# Patient Record
Sex: Female | Born: 1955 | Race: White | Hispanic: No | Marital: Single | Smoking: Former smoker
Health system: Southern US, Community
[De-identification: ages and names within clinical notes are randomized; demographics above are authoritative.]

## PROBLEM LIST (undated history)

## (undated) DIAGNOSIS — C169 Malignant neoplasm of stomach, unspecified: Secondary | ICD-10-CM

## (undated) DIAGNOSIS — G43909 Migraine, unspecified, not intractable, without status migrainosus: Secondary | ICD-10-CM

## (undated) HISTORY — DX: Migraine, unspecified, not intractable, without status migrainosus: G43.909

## (undated) HISTORY — DX: Malignant neoplasm of stomach, unspecified: C16.9

## (undated) HISTORY — PX: TONSILLECTOMY: SUR1361

---

## 2015-01-03 ENCOUNTER — Encounter: Payer: Self-pay | Admitting: Gastroenterology

## 2015-01-15 ENCOUNTER — Ambulatory Visit (INDEPENDENT_AMBULATORY_CARE_PROVIDER_SITE_OTHER): Payer: Self-pay | Admitting: Gastroenterology

## 2015-01-15 ENCOUNTER — Encounter: Payer: Self-pay | Admitting: Gastroenterology

## 2015-01-15 VITALS — BP 106/69 | HR 68 | Temp 97.2°F | Ht 66.0 in | Wt 108.6 lb

## 2015-01-15 DIAGNOSIS — R1013 Epigastric pain: Secondary | ICD-10-CM | POA: Insufficient documentation

## 2015-01-15 DIAGNOSIS — R634 Abnormal weight loss: Secondary | ICD-10-CM | POA: Insufficient documentation

## 2015-01-15 DIAGNOSIS — R1903 Right lower quadrant abdominal swelling, mass and lump: Secondary | ICD-10-CM

## 2015-01-15 DIAGNOSIS — R112 Nausea with vomiting, unspecified: Secondary | ICD-10-CM

## 2015-01-15 MED ORDER — PROMETHAZINE HCL 12.5 MG PO TABS: 12.5000 mg | ORAL_TABLET | Freq: Four times a day (QID) | ORAL | Status: DC | PRN

## 2015-01-15 NOTE — Progress Notes (Signed)
Primary Care Physician:  [name], MD  Primary Gastroenterologist:  Barney Drain, MD   Chief Complaint  Patient presents with  . Weight Loss  . Abdominal Pain  . Emesis    HPI:  Heidi Cooper is a 59 y.o. female here for further evaluation of N/V, abdominal pain and 35 pound weight loss.   She readily admits to seen GI in [place] about 3 weeks ago but states that she was dissatisfied with her care and will not be going back.  Several month history of GI symptoms, worse in the last couple months. States that she has felt "awful". She brought in a detailed calendar of her symptoms. Symptoms started abruptly the beginning of May. She woke up in the middle the night with right flank pain, pain into her back. That particular pain subsided over the course of several days. She saw her PCP, Dr. Bobby Rumpf, who ordered an abdominal ultrasound which was unremarkable. She subsequently describes a HIDA scan stating it was normal as well although she was sick all weekend after this test. She's had several month history of fullness in the epigastrium. Wake up in the middle the night with acid reflux, with take Gaviscon with minimal results. She was started on Zantac which she took for one month with no relief. Ultimately on placed on pantoprazole, has been on it for 2 months and still with no improvement of her symptoms. She now has frequent vomiting. She wakes up vomiting, not just with meals. No hematemesis. She's been finding it very hard to eat. She's lost 35 pounds in 3-4 months. She weighed 143 pounds before her illness started. Bowels move whenever she is able to eat. Really no constipation. No melena or rectal bleeding. Epigastric pain feels like a "charley horse". Pain radiates up into the throat.  Rarely takes ibuprofen. Denies BC or Goody powders.  Per PCP notes, patient's fiance died after getting burned smoking while using oxygen. Died a couple months ago.  Tried Zofran for nausea but made  her feels worse.    Current Outpatient Prescriptions  Medication Sig Dispense Refill  . fluticasone (FLONASE) 50 MCG/ACT nasal spray Place into the nose.    . pantoprazole (PROTONIX) 40 MG tablet Take by mouth.    . ranitidine (ZANTAC) 300 MG tablet Take by mouth.    . topiramate (TOPAMAX) 25 MG tablet Take by mouth.     No current facility-administered medications for this visit.    Allergies as of 01/15/2015 - Review Complete 01/15/2015  Allergen Reaction Noted  . Sulfa antibiotics  01/15/2015  . Penicillins Rash 01/15/2015    Past Medical History  Diagnosis Date  . Migraine     Past Surgical History  Procedure Laterality Date  . Tonsillectomy      Family History  Problem Relation Age of Onset  . Heart disease Mother   . Heart disease Father   . Colon cancer Neg Hx     Social History   Social History  . Marital Status: Unknown    Spouse Name: N/A  . Number of Children: 0  . Years of Education: N/A   Occupational History  . [employer]     Social History Main Topics  . Smoking status: Former Smoker    Quit date: 11/27/2014  . Smokeless tobacco: Not on file  . Alcohol Use: No  . Drug Use: No  . Sexual Activity: Not on file   Other Topics Concern  . Not on file   Social  History Narrative  . No narrative on file      ROS:  General: Negative for fever, chills, fatigue, weakness. See hpi. Eyes: Negative for vision changes.  ENT: Negative for hoarseness, difficulty swallowing , nasal congestion. CV: Negative for chest pain, angina, palpitations, dyspnea on exertion, peripheral edema.  Respiratory: Negative for dyspnea at rest, dyspnea on exertion, cough, sputum, wheezing.  GI: See history of present illness. GU:  Negative for dysuria, hematuria, urinary incontinence, urinary frequency, nocturnal urination.  MS: Negative for joint pain, see hpi Derm: Negative for rash or itching.  Neuro: Negative for weakness, abnormal sensation, seizure, frequent  headaches, memory loss, confusion.  Psych: Negative for anxiety, depression, suicidal ideation, hallucinations.  Endo:see hpi Heme: Negative for bruising or bleeding. Allergy: Negative for rash or hives.    Physical Examination:  BP 106/69 mmHg  Pulse 68  Temp(Src) 97.2 F (36.2 C)  Ht 5\' 6"  (1.676 m)  Wt 108 lb 9.6 oz (49.261 kg)  BMI 17.54 kg/m2   General: Well-nourished, well-developed in no acute distress.  Head: Normocephalic, atraumatic.   Eyes: Conjunctiva pink, no icterus. Mouth: Oropharyngeal mucosa moist and pink , no lesions erythema or exudate. Neck: Supple without thyromegaly, masses, or lymphadenopathy.  Lungs: Clear to auscultation bilaterally.  Heart: Regular rate and rhythm, no murmurs rubs or gallops.  Abdomen: Bowel sounds are normal, palpable mass in RLQ, tendernontender, nondistended, no hepatosplenomegaly or masses, no abdominal bruits or    hernia , no rebound or guarding.   Rectal: not performed Extremities: No lower extremity edema. No clubbing or deformities.  Neuro: Alert and oriented x 4 , grossly normal neurologically.  Skin: Warm and dry, no rash or jaundice.   Psych: Alert and cooperative, normal mood and affect.    Imaging Studies: No results found.

## 2015-01-15 NOTE — Patient Instructions (Addendum)
1. Please have your labs and CT done 01/18/15 or after. I office will schedule your CT.  2. Phenergan sent to your pharmacy for vomiting. May make you drowsy.

## 2015-01-16 DIAGNOSIS — R1903 Right lower quadrant abdominal swelling, mass and lump: Secondary | ICD-10-CM | POA: Insufficient documentation

## 2015-01-16 NOTE — Assessment & Plan Note (Signed)
59 year old lady with several month history of nausea/vomiting, abdominal pain, 35 pound weight loss. She reports negative gallbladder workup. Her abdominal ultrasound was negative. She states HIDA scan was negative as well. Symptoms have been progressive to the point that she's been unable to eat. Denies lower GI symptoms. On exam today, she has a palpable mass in the right lower quadrant. Concerning for malignancy. Explained to the patient we need to evaluate this prior to consideration of endoscopic evaluation. She is in agreement to a CT scan of the abdomen and pelvis with contrast, insurance goes into effect on September 1 and she would like to wait till then to have it done. Labs to be obtained as well. Further recommendations to follow.

## 2015-01-16 NOTE — Progress Notes (Signed)
CC'ED TO PCP 

## 2015-01-21 ENCOUNTER — Emergency Department (HOSPITAL_COMMUNITY): Payer: PRIVATE HEALTH INSURANCE

## 2015-01-21 ENCOUNTER — Emergency Department (HOSPITAL_COMMUNITY)
Admission: EM | Admit: 2015-01-21 | Discharge: 2015-01-21 | Disposition: A | Discharge status: Home / Self Care | Payer: PRIVATE HEALTH INSURANCE | Attending: Emergency Medicine | Admitting: Emergency Medicine

## 2015-01-21 ENCOUNTER — Encounter (HOSPITAL_COMMUNITY): Payer: Self-pay | Admitting: Cardiology

## 2015-01-21 DIAGNOSIS — Z880 Allergy status to penicillin: Secondary | ICD-10-CM | POA: Diagnosis not present

## 2015-01-21 DIAGNOSIS — Z7952 Long term (current) use of systemic steroids: Secondary | ICD-10-CM | POA: Diagnosis not present

## 2015-01-21 DIAGNOSIS — Z87891 Personal history of nicotine dependence: Secondary | ICD-10-CM | POA: Insufficient documentation

## 2015-01-21 DIAGNOSIS — K3184 Gastroparesis: Secondary | ICD-10-CM | POA: Diagnosis not present

## 2015-01-21 DIAGNOSIS — R1011 Right upper quadrant pain: Secondary | ICD-10-CM | POA: Diagnosis present

## 2015-01-21 DIAGNOSIS — G43909 Migraine, unspecified, not intractable, without status migrainosus: Secondary | ICD-10-CM | POA: Insufficient documentation

## 2015-01-21 DIAGNOSIS — R1084 Generalized abdominal pain: Secondary | ICD-10-CM

## 2015-01-21 DIAGNOSIS — R112 Nausea with vomiting, unspecified: Secondary | ICD-10-CM

## 2015-01-21 LAB — URINALYSIS, ROUTINE W REFLEX MICROSCOPIC
Bilirubin Urine: NEGATIVE
Glucose, UA: NEGATIVE mg/dL
LEUKOCYTES UA: NEGATIVE
NITRITE: NEGATIVE
PH: 5.5 (ref 5.0–8.0)
Protein, ur: NEGATIVE mg/dL
SPECIFIC GRAVITY, URINE: 1.01 (ref 1.005–1.030)
Urobilinogen, UA: 0.2 mg/dL (ref 0.0–1.0)

## 2015-01-21 LAB — CBC WITH DIFFERENTIAL/PLATELET
BASOS ABS: 0 10*3/uL (ref 0.0–0.1)
BASOS PCT: 1 % (ref 0–1)
Eosinophils Absolute: 0.1 10*3/uL (ref 0.0–0.7)
Eosinophils Relative: 1 % (ref 0–5)
HEMATOCRIT: 38.3 % (ref 36.0–46.0)
Hemoglobin: 13.3 g/dL (ref 12.0–15.0)
Lymphocytes Relative: 27 % (ref 12–46)
Lymphs Abs: 1.6 10*3/uL (ref 0.7–4.0)
MCH: 33 pg (ref 26.0–34.0)
MCHC: 34.7 g/dL (ref 30.0–36.0)
MCV: 95 fL (ref 78.0–100.0)
MONO ABS: 0.6 10*3/uL (ref 0.1–1.0)
Monocytes Relative: 9 % (ref 3–12)
NEUTROS ABS: 3.7 10*3/uL (ref 1.7–7.7)
Neutrophils Relative %: 62 % (ref 43–77)
PLATELETS: 208 10*3/uL (ref 150–400)
RBC: 4.03 MIL/uL (ref 3.87–5.11)
RDW: 13.7 % (ref 11.5–15.5)
WBC: 6 10*3/uL (ref 4.0–10.5)

## 2015-01-21 LAB — COMPREHENSIVE METABOLIC PANEL
ALBUMIN: 4.1 g/dL (ref 3.5–5.0)
ALT: 19 U/L (ref 14–54)
AST: 20 U/L (ref 15–41)
Alkaline Phosphatase: 40 U/L (ref 38–126)
Anion gap: 7 (ref 5–15)
BILIRUBIN TOTAL: 0.5 mg/dL (ref 0.3–1.2)
BUN: 18 mg/dL (ref 6–20)
CHLORIDE: 107 mmol/L (ref 101–111)
CO2: 26 mmol/L (ref 22–32)
Calcium: 9.4 mg/dL (ref 8.9–10.3)
Creatinine, Ser: 0.97 mg/dL (ref 0.44–1.00)
GFR calc Af Amer: >60 mL/min (ref >60)
GFR calc non Af Amer: >60 mL/min (ref >60)
GLUCOSE: 110 mg/dL — AB (ref 65–99)
POTASSIUM: 3.3 mmol/L — AB (ref 3.5–5.1)
Sodium: 140 mmol/L (ref 135–145)
Total Protein: 6.4 g/dL — ABNORMAL LOW (ref 6.5–8.1)

## 2015-01-21 LAB — LIPASE, BLOOD: Lipase: 31 U/L (ref 22–51)

## 2015-01-21 LAB — URINE MICROSCOPIC-ADD ON

## 2015-01-21 MED ORDER — IOHEXOL 300 MG/ML  SOLN
100.0000 mL | Freq: Once | INTRAMUSCULAR | Status: AC | PRN
  Administered 2015-01-21: 100 mL via INTRAVENOUS

## 2015-01-21 MED ORDER — ONDANSETRON 4 MG PO TBDP: 4.0000 mg | ORAL_TABLET | Freq: Three times a day (TID) | ORAL | Status: DC | PRN

## 2015-01-21 MED ORDER — METOCLOPRAMIDE HCL 10 MG PO TABS: 10.0000 mg | ORAL_TABLET | Freq: Three times a day (TID) | ORAL | Status: DC | PRN

## 2015-01-21 MED ORDER — SODIUM CHLORIDE 0.9 % IV BOLUS (SEPSIS)
1000.0000 mL | Freq: Once | INTRAVENOUS | Status: AC
  Administered 2015-01-21: 1000 mL via INTRAVENOUS

## 2015-01-21 MED ORDER — ONDANSETRON HCL 4 MG/2ML IJ SOLN
4.0000 mg | Freq: Once | INTRAMUSCULAR | Status: AC
  Administered 2015-01-21: 4 mg via INTRAVENOUS
  Filled 2015-01-21: qty 2

## 2015-01-21 MED ORDER — ONDANSETRON HCL 4 MG/2ML IJ SOLN: 4.0000 mg | Freq: Once | INTRAMUSCULAR | Status: DC

## 2015-01-21 MED ORDER — IOHEXOL 300 MG/ML  SOLN
50.0000 mL | Freq: Once | INTRAMUSCULAR | Status: AC | PRN
  Administered 2015-01-21: 50 mL via ORAL

## 2015-01-21 MED ORDER — ONDANSETRON HCL 4 MG/2ML IJ SOLN
INTRAMUSCULAR | Status: AC
  Filled 2015-01-21: qty 2

## 2015-01-21 NOTE — ED Provider Notes (Signed)
CSN: [id number]     Arrival date & time 01/21/15  1421 History   First MD Initiated Contact with Patient 01/21/15 1437     No chief complaint on file.    (Consider location/radiation/quality/duration/timing/severity/associated sxs/prior Treatment) HPI  The patient is a 59 year old female, she has no past abdominal surgical history, she has a history of approximately 8 months of a progressive gastrointestinal illness which started with pain in the right upper quadrant radiating to the right back, this has been progressive causing more nausea and vomiting reporting that she is unable to hold down any food or fluids. She reports that this is not an immediate vomit however between 2 and 24 hours after eating she will bring up the food or fluids. She feels dehydrated, she has a feeling of pain that goes from her epigastrium up into her chest and her neck, she endorses approximately 35 pounds of weight loss over the last several months, has seen the gastroenterologist was performed ultrasounds, HIDA scan and has recommended CT scan to rule out malignancy. The patient has had increased vomiting today thus reporting to the hospital for further evaluation. She denies fevers chills or diarrhea, states that her stools are small, no blood in the stool    Past Medical History  Diagnosis Date  . Migraine    Past Surgical History  Procedure Laterality Date  . Tonsillectomy     Family History  Problem Relation Age of Onset  . Heart disease Mother   . Heart disease Father   . Colon cancer Neg Hx    Social History  Substance Use Topics  . Smoking status: Former Smoker    Quit date: 11/27/2014  . Smokeless tobacco: None  . Alcohol Use: No   OB History    No data available     Review of Systems  All other systems reviewed and are negative.     Allergies  Other; Sulfa antibiotics; and Penicillins  Home Medications   Prior to Admission medications   Medication Sig Start Date End Date  Taking? Authorizing Provider  pantoprazole (PROTONIX) 40 MG tablet Take 40 mg by mouth at bedtime.  12/04/14  Yes Historical Provider, MD  ranitidine (ZANTAC) 300 MG tablet Take 300 mg by mouth 2 (two) times daily.  11/06/14  Yes Historical Provider, MD  topiramate (TOPAMAX) 25 MG tablet Take 25 mg by mouth at bedtime.  01/09/12  Yes Historical Provider, MD  fluticasone (FLONASE) 50 MCG/ACT nasal spray Place 2 sprays into the nose daily as needed for allergies.     Historical Provider, MD  metoCLOPramide (REGLAN) 10 MG tablet Take 1 tablet (10 mg total) by mouth 3 (three) times daily as needed for nausea (headache / nausea). 01/21/15   Noemi Chapel, MD  ondansetron (ZOFRAN ODT) 4 MG disintegrating tablet Take 1 tablet (4 mg total) by mouth every 8 (eight) hours as needed for nausea. 01/21/15   Noemi Chapel, MD  promethazine (PHENERGAN) 12.5 MG tablet Take 1 tablet (12.5 mg total) by mouth every 6 (six) hours as needed for nausea or vomiting. 01/15/15   Mahala Menghini, PA-C   BP 112/66 mmHg  Pulse 69  Temp(Src) 98.1 F (36.7 C) (Oral)  Resp 16  Ht 5\' 6"  (1.676 m)  Wt 108 lb (48.988 kg)  BMI 17.44 kg/m2  SpO2 100% Physical Exam  Constitutional: She appears well-developed and well-nourished. No distress.  HENT:  Head: Normocephalic and atraumatic.  Mouth/Throat: Oropharynx is clear and moist. No oropharyngeal exudate.  Eyes: Conjunctivae and EOM are normal. Pupils are equal, round, and reactive to light. Right eye exhibits no discharge. Left eye exhibits no discharge. No scleral icterus.  Neck: Normal range of motion. Neck supple. No JVD present. No thyromegaly present.  Cardiovascular: Normal rate, regular rhythm, normal heart sounds and intact distal pulses.  Exam reveals no gallop and no friction rub.   No murmur heard. Pulmonary/Chest: Effort normal and breath sounds normal. No respiratory distress. She has no wheezes. She has no rales.  Abdominal: Soft. Bowel sounds are normal. She exhibits no  distension and no mass. There is tenderness ( Mild tenderness in the epigastrium, no guarding, no masses, no masses palpated in the lower abdomen, very soft).  Musculoskeletal: Normal range of motion. She exhibits no edema or tenderness.  Lymphadenopathy:    She has no cervical adenopathy.  Neurological: She is alert. Coordination normal.  Skin: Skin is warm and dry. No rash noted. No erythema.  Psychiatric: She has a normal mood and affect. Her behavior is normal.  Nursing note and vitals reviewed.   ED Course  Procedures (including critical care time) Labs Review Labs Reviewed  COMPREHENSIVE METABOLIC PANEL - Abnormal; Notable for the following:    Potassium 3.3 (*)    Glucose, Bld 110 (*)    Total Protein 6.4 (*)    All other components within normal limits  URINALYSIS, ROUTINE W REFLEX MICROSCOPIC (NOT AT [facility]) - Abnormal; Notable for the following:    Hgb urine dipstick SMALL (*)    Ketones, ur TRACE (*)    All other components within normal limits  URINE MICROSCOPIC-ADD ON - Abnormal; Notable for the following:    Casts GRANULAR CAST (*)    All other components within normal limits  CBC WITH DIFFERENTIAL/PLATELET  LIPASE, BLOOD    Imaging Review Ct Abdomen Pelvis W Contrast  01/21/2015   CLINICAL DATA:  59 year old female with abdominal pain and vomiting for several months, worst for 1 week. Initial encounter.  EXAM: CT ABDOMEN AND PELVIS WITH CONTRAST  TECHNIQUE: Multidetector CT imaging of the abdomen and pelvis was performed using the standard protocol following bolus administration of intravenous contrast.  CONTRAST:  34mL OMNIPAQUE IOHEXOL 300 MG/ML SOLN, 176mL OMNIPAQUE IOHEXOL 300 MG/ML SOLN  COMPARISON:  None.  FINDINGS: Negative lung bases.  Mild elevation of the left hemidiaphragm.  No acute osseous abnormality identified.  No pelvic free fluid. Diminutive bladder. Calcified uterine fibroid measuring about 3 cm diameter. Diminutive adnexa.  Redundant sigmoid colon with  diverticulosis. Negative rectum. Negative left colon. Redundant transverse colon extends down into the pelvis but is otherwise negative. Mildly redundant hepatic flexure. Negative right colon. Appendix appears diminutive or absent. Negative terminal ileum. No dilated small bowel.  Massively distended and ptotic appearing stomach (coronal image 33). Retained food debris mixed with oral contrast in the stomach. Contrast has reached the distal small bowel but not yet the ileocecal valve. The duodenum is within normal limits. No abdominal free air or free fluid.  Liver, gallbladder, spleen, pancreas, and adrenal glands are within normal limits. The portal venous system is patent. Major arterial structures in the abdomen and pelvis are patent. Minimal calcified aortic atherosclerosis. No lymphadenopathy.  IMPRESSION: 1. Gastroptosis with massive enlargement of the stomach, but no acute bowel obstruction. Query severe gastroparesis. See coronal image 33. 2. No other acute or inflammatory process identified in the abdomen or pelvis. 3. Diverticulosis of the sigmoid colon. Redundant sigmoid and transverse colon. Fibroid uterus.   Electronically Signed  By: Genevie Ann M.D.   On: 01/21/2015 18:24   I have personally reviewed and evaluated these images and lab results as part of my medical decision-making.   MDM   Final diagnoses:  Generalized abdominal pain  Non-intractable vomiting with nausea, vomiting of unspecified type  Gastroparesis    Labs and CT scan ordered, pt at increased risk for CA given weight loss and persistent vomiting.  CT results shared with pt - stable VS and labs,  UA unremarkable - given 2L of IVF, stable for d/c.  Pt in agreement - given copy of her labs.  I have personally viewed and interpreted the imaging and agree with radiologist interpretation.  Meds given in ED:  Medications  sodium chloride 0.9 % bolus 1,000 mL (0 mLs Intravenous Stopped 01/21/15 1754)  ondansetron  (ZOFRAN) injection 4 mg (4 mg Intravenous Given 01/21/15 1521)  iohexol (OMNIPAQUE) 300 MG/ML solution 50 mL (50 mLs Oral Contrast Given 01/21/15 1733)  iohexol (OMNIPAQUE) 300 MG/ML solution 100 mL (100 mLs Intravenous Contrast Given 01/21/15 1734)  sodium chloride 0.9 % bolus 1,000 mL (1,000 mLs Intravenous New Bag/Given 01/21/15 1754)    New Prescriptions   METOCLOPRAMIDE (REGLAN) 10 MG TABLET    Take 1 tablet (10 mg total) by mouth 3 (three) times daily as needed for nausea (headache / nausea).   ONDANSETRON (ZOFRAN ODT) 4 MG DISINTEGRATING TABLET    Take 1 tablet (4 mg total) by mouth every 8 (eight) hours as needed for nausea.       Noemi Chapel, MD 01/21/15 [id number]

## 2015-01-21 NOTE — Discharge Instructions (Signed)

## 2015-01-21 NOTE — ED Notes (Signed)
Abdominal pain and vomiting for a week.  Seen PCP last Monday. Is to have an OP CT  of the abdomen.

## 2015-01-23 ENCOUNTER — Ambulatory Visit (INDEPENDENT_AMBULATORY_CARE_PROVIDER_SITE_OTHER): Payer: PRIVATE HEALTH INSURANCE | Admitting: Nurse Practitioner

## 2015-01-23 ENCOUNTER — Other Ambulatory Visit: Payer: Self-pay

## 2015-01-23 ENCOUNTER — Encounter: Payer: Self-pay | Admitting: Nurse Practitioner

## 2015-01-23 DIAGNOSIS — R112 Nausea with vomiting, unspecified: Secondary | ICD-10-CM | POA: Diagnosis not present

## 2015-01-23 DIAGNOSIS — R1013 Epigastric pain: Secondary | ICD-10-CM

## 2015-01-23 DIAGNOSIS — Z1211 Encounter for screening for malignant neoplasm of colon: Secondary | ICD-10-CM

## 2015-01-23 DIAGNOSIS — R634 Abnormal weight loss: Secondary | ICD-10-CM | POA: Diagnosis not present

## 2015-01-23 MED ORDER — PEG 3350-KCL-NA BICARB-NACL 420 G PO SOLR: 4000.0000 mL | Freq: Once | ORAL | Status: DC

## 2015-01-23 MED ORDER — ONDANSETRON 4 MG PO TBDP: 4.0000 mg | ORAL_TABLET | Freq: Three times a day (TID) | ORAL | Status: DC | PRN

## 2015-01-23 NOTE — Progress Notes (Signed)
Primary Care Physician:  [name], MD Primary Gastroenterologist:  Dr. Oneida Alar  Chief Complaint  Patient presents with  . Abdominal Pain    HPI:   59 year old female referred by emergency room. ER notes reviewed. Corneal their notes patient presented with a months of progressive GI problems scan is right upper quadrant pain radiating to the back which has caused more nausea and vomiting. She had difficulty keeping fluids down. Has epigastric pain which radiates up into her neck and chest. Last seen in our office 01/15/2015. His had a several month history of nausea vomiting, abdominal pain, 35 pound weight loss. Negative gallbladder workup. Patient does have a palpable mass in the right lower quadrant concerning for malignancy. Agree to CT of the abdomen and pelvis with contrast however needed to wait until September 1 due to insurance going into a fact that time. CT performed on 09/06/2014 which found gastric ptosis with massive enlargement of the stomach but no bowel obstruction, query severe gastroparesis. No other acute or inflammatory processes identified in the abdomen or pelvis, diverticulosis of the colon and redundant sigmoid and transverse colon. The contrast, however, did not get as far as the ileocecal valve. Fibroid uterus. She is discharged in the emergency room on Reglan and Zofran ODT.  Today she states she was not able to get the Reglan filled yet, but did get Zofran filled and has helped her nausea pretty well. Still has abdominal pain. She has not vomited today, last episode was yesterday at work. Denies fever, chills, hematochezia, and melena. Has never had a colonoscopy or EGD before. Denies chest pain, dyspnea, dizziness, lightheadedness, syncope, near syncope. Denies any other upper or lower GI symptoms.   Past Medical History  Diagnosis Date  . Migraine     Past Surgical History  Procedure Laterality Date  . Tonsillectomy      Current Outpatient  Prescriptions  Medication Sig Dispense Refill  . fluticasone (FLONASE) 50 MCG/ACT nasal spray Place 2 sprays into the nose daily as needed for allergies.     Marland Kitchen metoCLOPramide (REGLAN) 10 MG tablet Take 1 tablet (10 mg total) by mouth 3 (three) times daily as needed for nausea (headache / nausea). 20 tablet 0  . ondansetron (ZOFRAN ODT) 4 MG disintegrating tablet Take 1 tablet (4 mg total) by mouth every 8 (eight) hours as needed for nausea. 10 tablet 0  . pantoprazole (PROTONIX) 40 MG tablet Take 40 mg by mouth at bedtime.     . promethazine (PHENERGAN) 12.5 MG tablet Take 1 tablet (12.5 mg total) by mouth every 6 (six) hours as needed for nausea or vomiting. 20 tablet 0  . ranitidine (ZANTAC) 300 MG tablet Take 300 mg by mouth 2 (two) times daily.     Marland Kitchen topiramate (TOPAMAX) 25 MG tablet Take 25 mg by mouth at bedtime.      No current facility-administered medications for this visit.    Allergies as of 01/23/2015 - Review Complete 01/23/2015  Allergen Reaction Noted  . Other Itching 01/21/2015  . Sulfa antibiotics Other (See Comments) 01/15/2015  . Penicillins Rash 01/15/2015    Family History  Problem Relation Age of Onset  . Heart disease Mother   . Heart disease Father   . Colon cancer Neg Hx     Social History   Social History  . Marital Status: Unknown    Spouse Name: N/A  . Number of Children: 0  . Years of Education: N/A   Occupational History  .  [employer]     Social History Main Topics  . Smoking status: Former Smoker    Quit date: 11/27/2014  . Smokeless tobacco: Not on file  . Alcohol Use: No  . Drug Use: No  . Sexual Activity: Not on file   Other Topics Concern  . Not on file   Social History Narrative    Review of Systems: 10-point ROS negative except as per HPI.   Physical Exam: BP 119/72 mmHg  Pulse 67  Temp(Src) 97.4 F (36.3 C) (Oral)  Ht 5\' 6"  (1.676 m)  Wt 111 lb (50.349 kg)  BMI 17.92 kg/m2 General:   Alert and oriented. Pleasant and  cooperative. Well-nourished and well-developed.  Head:  Normocephalic and atraumatic. Eyes:  Without icterus, sclera clear and conjunctiva pink.  Ears:  Normal auditory acuity. Cardiovascular:  S1, S2 present without murmurs appreciated. Normal pulses noted. Extremities without clubbing or edema. Respiratory:  Clear to auscultation bilaterally. No wheezes, rales, or rhonchi. No distress.  Gastrointestinal:  +BS, rounded but soft, and non-distended. Mild epigastric TTP. No HSM noted. No guarding or rebound. No masses appreciated.  Rectal:  Deferred  Skin:  Intact without significant lesions or rashes. Neurologic:  Alert and oriented x4;  grossly normal neurologically. Psych:  Alert and cooperative. Normal mood and affect. Heme/Lymph/Immune: No excessive bruising noted.    01/23/2015 10:14 AM

## 2015-01-23 NOTE — Patient Instructions (Signed)
1. We will schedule your procedure for you. 2. Keep taking your medications. I have sent in a refill of your Zofran. 3. Follow-up in 6 weeks

## 2015-01-25 ENCOUNTER — Other Ambulatory Visit: Payer: Self-pay

## 2015-01-25 NOTE — Assessment & Plan Note (Signed)
Patient with concerning weight loss of 35 pounds. She is due for colonoscopy as she has never had one. This point we'll plan for colonoscopy and endoscopy as noted above for cancer screening as well as further evaluation of her upper GI symptoms.

## 2015-01-25 NOTE — Progress Notes (Signed)
CC'ED TO PCP 

## 2015-01-25 NOTE — Assessment & Plan Note (Signed)
Recent nausea and vomiting better controlled on Zofran. She was given Reglan by the emergency room however she has not had it filled yet. Her nausea and vomiting as well as her distended abdomen on CT could be due to severe gastroparesis however outlet obstruction is also possible. At this point we'll refer her for an EGD to further evaluate as well as screening colonoscopy because she is due.  Proceed with EGD and TCS with 25 mg Phenergan with Dr. Oneida Alar in near future: the risks, benefits, and alternatives have been discussed with the patient in detail. The patient states understanding and desires to proceed.  Patient is not on any chronic pain medicines or anxiolytics. She is not on any antidepressants or anticoagulants. She does take Phenergan for nausea. We will provide for Phenergan prior to procedure for nausea control.

## 2015-01-25 NOTE — Assessment & Plan Note (Signed)
Patient with epigastric pain likely due to distended stomach. CT abdomen and pelvis showed enlarged stomach without small bowel obstruction. We will work her up for colonoscopy and endoscopy as noted above. Return for follow-up in 6 weeks and consider gastric emptying study if no abnormalities found on endoscopy.

## 2015-01-31 ENCOUNTER — Telehealth: Payer: Self-pay | Admitting: Gastroenterology

## 2015-01-31 MED ORDER — METOCLOPRAMIDE HCL 10 MG PO TABS: 10.0000 mg | ORAL_TABLET | Freq: Three times a day (TID) | ORAL | Status: DC | PRN

## 2015-01-31 NOTE — Telephone Encounter (Signed)
LM that the Rx has been sent to pharmacy.

## 2015-01-31 NOTE — Addendum Note (Signed)
Addended by: Orvil Feil on: 01/31/2015 04:26 PM   Modules accepted: Orders

## 2015-01-31 NOTE — Telephone Encounter (Signed)
Patient called this afternoon to ask if we could call in a prescription of Metoclopram-Sol 5mg / 25ml to [facility] in [place] on [address]. She was seen in the ED recently and they prescribed this to her and it works well and she was asking for a refill/prescription. [phone]

## 2015-01-31 NOTE — Telephone Encounter (Signed)
Completed.

## 2015-01-31 NOTE — Telephone Encounter (Signed)
Routing to refill box  

## 2015-02-01 ENCOUNTER — Telehealth: Payer: Self-pay | Admitting: Gastroenterology

## 2015-02-01 NOTE — Telephone Encounter (Signed)
Routing to the refill box. 

## 2015-02-01 NOTE — Telephone Encounter (Signed)
Patient called late this afternoon saying that she needs her Reglan in a liquid form and not a pill form. She needs this ASAP before she runs out. [phone]

## 2015-02-02 MED ORDER — METOCLOPRAMIDE HCL 5 MG/5ML PO SOLN: 10.0000 mg | Freq: Three times a day (TID) | ORAL | Status: DC

## 2015-02-02 NOTE — Telephone Encounter (Signed)
Pt has ov with EG on 03/08/15

## 2015-02-02 NOTE — Telephone Encounter (Signed)
I did one month supply. No further refills until she sees [place] in follow-up.

## 2015-02-02 NOTE — Addendum Note (Signed)
Addended by: Orvil Feil on: 02/02/2015 05:46 AM   Modules accepted: Orders

## 2015-02-06 ENCOUNTER — Ambulatory Visit (HOSPITAL_COMMUNITY): Payer: PRIVATE HEALTH INSURANCE

## 2015-02-06 ENCOUNTER — Encounter (HOSPITAL_COMMUNITY)
Admission: AD | Disposition: A | Discharge status: Home / Self Care | Payer: Self-pay | Source: Ambulatory Visit | Attending: General Surgery

## 2015-02-06 ENCOUNTER — Encounter (HOSPITAL_COMMUNITY): Payer: Self-pay | Admitting: *Deleted

## 2015-02-06 ENCOUNTER — Inpatient Hospital Stay (HOSPITAL_COMMUNITY)
Admission: AD | Admit: 2015-02-06 | Discharge: 2015-02-19 | DRG: 327 | Disposition: A | Discharge status: Home / Self Care | Payer: PRIVATE HEALTH INSURANCE | Source: Ambulatory Visit | Attending: General Surgery | Admitting: General Surgery

## 2015-02-06 DIAGNOSIS — Z8249 Family history of ischemic heart disease and other diseases of the circulatory system: Secondary | ICD-10-CM | POA: Diagnosis not present

## 2015-02-06 DIAGNOSIS — K311 Adult hypertrophic pyloric stenosis: Secondary | ICD-10-CM | POA: Diagnosis present

## 2015-02-06 DIAGNOSIS — G43709 Chronic migraine without aura, not intractable, without status migrainosus: Secondary | ICD-10-CM | POA: Diagnosis present

## 2015-02-06 DIAGNOSIS — Z87891 Personal history of nicotine dependence: Secondary | ICD-10-CM

## 2015-02-06 DIAGNOSIS — E861 Hypovolemia: Secondary | ICD-10-CM | POA: Diagnosis present

## 2015-02-06 DIAGNOSIS — K210 Gastro-esophageal reflux disease with esophagitis: Secondary | ICD-10-CM | POA: Diagnosis present

## 2015-02-06 DIAGNOSIS — Z681 Body mass index (BMI) 19 or less, adult: Secondary | ICD-10-CM | POA: Diagnosis not present

## 2015-02-06 DIAGNOSIS — IMO0002 Reserved for concepts with insufficient information to code with codable children: Secondary | ICD-10-CM | POA: Diagnosis present

## 2015-02-06 DIAGNOSIS — C169 Malignant neoplasm of stomach, unspecified: Secondary | ICD-10-CM | POA: Diagnosis present

## 2015-02-06 DIAGNOSIS — Z1211 Encounter for screening for malignant neoplasm of colon: Secondary | ICD-10-CM

## 2015-02-06 DIAGNOSIS — E876 Hypokalemia: Secondary | ICD-10-CM | POA: Diagnosis present

## 2015-02-06 DIAGNOSIS — K219 Gastro-esophageal reflux disease without esophagitis: Secondary | ICD-10-CM | POA: Diagnosis present

## 2015-02-06 DIAGNOSIS — K229 Disease of esophagus, unspecified: Secondary | ICD-10-CM

## 2015-02-06 DIAGNOSIS — E440 Moderate protein-calorie malnutrition: Secondary | ICD-10-CM | POA: Diagnosis present

## 2015-02-06 DIAGNOSIS — R739 Hyperglycemia, unspecified: Secondary | ICD-10-CM | POA: Diagnosis present

## 2015-02-06 DIAGNOSIS — R112 Nausea with vomiting, unspecified: Secondary | ICD-10-CM | POA: Diagnosis present

## 2015-02-06 HISTORY — PX: ESOPHAGOGASTRODUODENOSCOPY: SHX5428

## 2015-02-06 LAB — CBC WITH DIFFERENTIAL/PLATELET
BASOS ABS: 0 10*3/uL (ref 0.0–0.1)
BASOS PCT: 0 %
Eosinophils Absolute: 0 10*3/uL (ref 0.0–0.7)
Eosinophils Relative: 0 %
HEMATOCRIT: 31 % — AB (ref 36.0–46.0)
HEMOGLOBIN: 10.8 g/dL — AB (ref 12.0–15.0)
LYMPHS PCT: 23 %
Lymphs Abs: 1.3 10*3/uL (ref 0.7–4.0)
MCH: 34 pg (ref 26.0–34.0)
MCHC: 34.8 g/dL (ref 30.0–36.0)
MCV: 97.5 fL (ref 78.0–100.0)
MONO ABS: 0.5 10*3/uL (ref 0.1–1.0)
Monocytes Relative: 9 %
NEUTROS ABS: 3.8 10*3/uL (ref 1.7–7.7)
NEUTROS PCT: 68 %
Platelets: 158 10*3/uL (ref 150–400)
RBC: 3.18 MIL/uL — AB (ref 3.87–5.11)
RDW: 13.6 % (ref 11.5–15.5)
WBC: 5.5 10*3/uL (ref 4.0–10.5)

## 2015-02-06 LAB — COMPREHENSIVE METABOLIC PANEL
ALBUMIN: 3.2 g/dL — AB (ref 3.5–5.0)
ALK PHOS: 30 U/L — AB (ref 38–126)
ALT: 10 U/L — ABNORMAL LOW (ref 14–54)
ANION GAP: 5 (ref 5–15)
AST: 13 U/L — ABNORMAL LOW (ref 15–41)
BILIRUBIN TOTAL: 0.7 mg/dL (ref 0.3–1.2)
BUN: 20 mg/dL (ref 6–20)
CALCIUM: 7.9 mg/dL — AB (ref 8.9–10.3)
CO2: 22 mmol/L (ref 22–32)
Chloride: 112 mmol/L — ABNORMAL HIGH (ref 101–111)
Creatinine, Ser: 0.7 mg/dL (ref 0.44–1.00)
GFR calc non Af Amer: >60 mL/min (ref >60)
GLUCOSE: 79 mg/dL (ref 65–99)
POTASSIUM: 3.9 mmol/L (ref 3.5–5.1)
SODIUM: 139 mmol/L (ref 135–145)
TOTAL PROTEIN: 4.8 g/dL — AB (ref 6.5–8.1)

## 2015-02-06 LAB — PROTIME-INR
INR: 1.29 (ref 0.00–1.49)
Prothrombin Time: 16.3 seconds — ABNORMAL HIGH (ref 11.6–15.2)

## 2015-02-06 LAB — APTT: APTT: 25 s (ref 24–37)

## 2015-02-06 SURGERY — EGD (ESOPHAGOGASTRODUODENOSCOPY)
Anesthesia: Moderate Sedation

## 2015-02-06 MED ORDER — TOPIRAMATE 25 MG PO TABS
ORAL_TABLET | ORAL | Status: AC
  Filled 2015-02-06: qty 1

## 2015-02-06 MED ORDER — STERILE WATER FOR IRRIGATION IR SOLN
Status: DC | PRN
  Administered 2015-02-06: 13:00:00

## 2015-02-06 MED ORDER — ONDANSETRON HCL 4 MG/2ML IJ SOLN
4.0000 mg | Freq: Three times a day (TID) | INTRAMUSCULAR | Status: DC
  Administered 2015-02-06 – 2015-02-09 (×10): 4 mg via INTRAVENOUS
  Filled 2015-02-06 (×12): qty 2

## 2015-02-06 MED ORDER — MIDAZOLAM HCL 5 MG/5ML IJ SOLN
INTRAMUSCULAR | Status: AC
  Filled 2015-02-06: qty 10

## 2015-02-06 MED ORDER — TOPIRAMATE 25 MG PO TABS
25.0000 mg | ORAL_TABLET | Freq: Every day | ORAL | Status: DC
  Administered 2015-02-06 – 2015-02-08 (×3): 25 mg via ORAL
  Filled 2015-02-06 (×6): qty 1

## 2015-02-06 MED ORDER — HYDROMORPHONE HCL 1 MG/ML IJ SOLN: 0.5000 mg | INTRAMUSCULAR | Status: DC | PRN

## 2015-02-06 MED ORDER — LIDOCAINE VISCOUS 2 % MT SOLN
OROMUCOSAL | Status: DC | PRN
  Administered 2015-02-06: 1 via OROMUCOSAL

## 2015-02-06 MED ORDER — PROMETHAZINE HCL 25 MG/ML IJ SOLN
INTRAMUSCULAR | Status: AC
  Filled 2015-02-06: qty 1

## 2015-02-06 MED ORDER — PROMETHAZINE HCL 25 MG/ML IJ SOLN
12.5000 mg | Freq: Four times a day (QID) | INTRAMUSCULAR | Status: DC | PRN
  Administered 2015-02-07: 12.5 mg via INTRAVENOUS
  Filled 2015-02-06: qty 1

## 2015-02-06 MED ORDER — SODIUM CHLORIDE 0.9 % IJ SOLN
10.0000 mL | Freq: Two times a day (BID) | INTRAMUSCULAR | Status: DC
  Administered 2015-02-06: 10 mL
  Administered 2015-02-07: 20 mL
  Administered 2015-02-08 – 2015-02-09 (×3): 10 mL
  Administered 2015-02-10: 20 mL
  Administered 2015-02-10: 10 mL
  Administered 2015-02-10: 20 mL
  Administered 2015-02-11 – 2015-02-19 (×13): 10 mL

## 2015-02-06 MED ORDER — LIDOCAINE VISCOUS 2 % MT SOLN
OROMUCOSAL | Status: AC
  Filled 2015-02-06: qty 15

## 2015-02-06 MED ORDER — ENOXAPARIN SODIUM 40 MG/0.4ML ~~LOC~~ SOLN
40.0000 mg | SUBCUTANEOUS | Status: DC
  Administered 2015-02-06 – 2015-02-18 (×13): 40 mg via SUBCUTANEOUS
  Filled 2015-02-06 (×13): qty 0.4

## 2015-02-06 MED ORDER — PANTOPRAZOLE SODIUM 40 MG IV SOLR
40.0000 mg | Freq: Two times a day (BID) | INTRAVENOUS | Status: DC
  Administered 2015-02-06 – 2015-02-09 (×6): 40 mg via INTRAVENOUS
  Filled 2015-02-06 (×6): qty 40

## 2015-02-06 MED ORDER — SODIUM CHLORIDE 0.9 % IJ SOLN
INTRAMUSCULAR | Status: AC
  Filled 2015-02-06: qty 3

## 2015-02-06 MED ORDER — DEXTROSE 10 % IV SOLN
INTRAVENOUS | Status: DC
  Administered 2015-02-06 – 2015-02-09 (×3): via INTRAVENOUS

## 2015-02-06 MED ORDER — PROMETHAZINE HCL 25 MG/ML IJ SOLN
25.0000 mg | Freq: Once | INTRAMUSCULAR | Status: AC
  Administered 2015-02-06: 25 mg via INTRAVENOUS

## 2015-02-06 MED ORDER — KCL IN DEXTROSE-NACL 20-5-0.9 MEQ/L-%-% IV SOLN
INTRAVENOUS | Status: DC
  Administered 2015-02-06 – 2015-02-07 (×2): via INTRAVENOUS

## 2015-02-06 MED ORDER — MEPERIDINE HCL 100 MG/ML IJ SOLN
INTRAMUSCULAR | Status: AC
  Filled 2015-02-06: qty 2

## 2015-02-06 MED ORDER — MEPERIDINE HCL 100 MG/ML IJ SOLN
INTRAMUSCULAR | Status: DC | PRN
  Administered 2015-02-06 (×2): 25 mg

## 2015-02-06 MED ORDER — HYDROMORPHONE HCL 1 MG/ML IJ SOLN
1.0000 mg | Freq: Three times a day (TID) | INTRAMUSCULAR | Status: DC
  Administered 2015-02-06 – 2015-02-09 (×8): 1 mg via INTRAVENOUS
  Filled 2015-02-06 (×9): qty 1

## 2015-02-06 MED ORDER — SODIUM CHLORIDE 0.9 % IV SOLN
INTRAVENOUS | Status: DC
  Administered 2015-02-06: 12:00:00 via INTRAVENOUS

## 2015-02-06 MED ORDER — SODIUM CHLORIDE 0.9 % IJ SOLN: 10.0000 mL | INTRAMUSCULAR | Status: DC | PRN

## 2015-02-06 MED ORDER — MIDAZOLAM HCL 5 MG/5ML IJ SOLN
INTRAMUSCULAR | Status: DC | PRN
  Administered 2015-02-06 (×2): 1 mg via INTRAVENOUS
  Administered 2015-02-06: 2 mg via INTRAVENOUS

## 2015-02-06 MED ORDER — FLUTICASONE PROPIONATE 50 MCG/ACT NA SUSP
2.0000 | Freq: Every day | NASAL | Status: DC
Start: 2015-02-06 — End: 2015-02-19
  Filled 2015-02-06 (×4): qty 16

## 2015-02-06 NOTE — Progress Notes (Signed)
REVIEWED-NO ADDITIONAL RECOMMENDATIONS. 

## 2015-02-06 NOTE — Op Note (Addendum)
[facility] [address] [place], [address]   ENDOSCOPY PROCEDURE REPORT  PATIENT: Heidi, Cooper  MR#: [id number] BIRTHDATE: 09-17-1955 , 44  yrs. old GENDER: female  ENDOSCOPIST: Danie Binder, MD REFERRED BY:   [name], MD  PROCEDURE DATE: 02-11-15 PROCEDURE:   EGD w/ biopsy  INDICATIONS:epigastric pain.   nausea.   vomiting. DENIES NSAID USE. 80 LB WEIGH LOSS UNINTENTIONAL. PMHx: TOBACCO USE MEDICATIONS: PHENERGAN 25 MG IV, PHENERGAN 25 MG IV, Demerol 50 mg IV, and Versed 4 mg IV TOPICAL ANESTHETIC:   Viscous Xylocaine ASA CLASS:  DESCRIPTION OF PROCEDURE:     Physical exam was performed.  Informed consent was obtained from the patient after explaining the benefits, risks, and alternatives to the procedure.  The patient was connected to the monitor and placed in the left lateral position.  Continuous oxygen was provided by nasal cannula and IV medicine administered through an indwelling cannula.  After administration of sedation, the patients esophagus was intubated and the [id number] ([id number])  endoscope was advanced under direct visualization to the pylorus.  The scope was removed slowly by carefully examining the color, texture, anatomy, and integrity of the mucosa on the way out.  The patient was recovered in endoscopy and discharged home in satisfactory condition.  Estimated blood loss is zero unless otherwise noted in this procedure report.    ESOPHAGUS: SEVERE INFLAMMATION BEGINNING INTHE MID-ESOPHAGUS. STOMACH: 2.5 Ls ASPIRATED FROM TEH STOMACH.  UNABLE TOPASS ADULT GASTROSCOPE BEYOND THE PYLORUS.  ULCERATED AND STRICTURED PYLORUS WITH HEAPED UP EDGES.  COLD FORCEPS BIOPSIES OBTAINED(6/2). COMPLICATIONS: There were no immediate complications.  ENDOSCOPIC IMPRESSION: 1.   SEVERE ESOPHAGITIS 2.   GASTRIC OUTLETOBSTRUCTION  RECOMMENDATIONS: ADMIT TO [facility]. STRICT NPO.  PICC LINE THEN TPN. AWAIT BIOPSY. DISCUSSED WITHD R.  JENKINS.   ANTICIPATE SURGEY ON SEP 23.  REPEAT EXAM:    _______________________________ eSignedDanie Binder, MD 02/11/2015 6:22 PM     CPT CODES: ICD CODES:  The ICD and CPT codes recommended by this software are interpretations from the data that the clinical staff has captured with the software.  The verification of the translation of this report to the ICD and CPT codes and modifiers is the sole responsibility of the health care institution and practicing physician where this report was generated.  [facility]. will not be held responsible for the validity of the ICD and CPT codes included on this report.  AMA assumes no liability for data contained or not contained herein. CPT is a Designer, television/film set of the [employer].

## 2015-02-06 NOTE — H&P (Addendum)
History and Physical  Heidi Cooper GEX:[id number] DOB: [birth year] DOA: 02/06/2015  Referring physician: EDP PCP: [name], MD   Chief Complaint: n/v/ ab pain, weight loss  HPI: Heidi Cooper is a 60 y.o. female   With no significant past medical history except migraine headache, has been having n/v/ab pain and significant weight loss, today she had EGD found to have gastric outlet obstruction by Dr. Oneida Alar. Dr. Oneida Alar contacted general surgery Dr. Arnoldo Morale. hospitalist called to direct admit patient.   Patient denies fever, no chest pain, no sob, no edema. She quit smoking two months ago.  Review of Systems:  Detail per HPI, Review of systems are otherwise negative  Past Medical History  Diagnosis Date  . Migraine    Past Surgical History  Procedure Laterality Date  . Tonsillectomy     Social History:  reports that she quit smoking about 2 months ago. She does not have any smokeless tobacco history on file. She reports that she does not drink alcohol or use illicit drugs. Patient lives at home & is able to participate in activities of daily living independently   Allergies  Allergen Reactions  . Other Itching    Mycins cause itching   . Sulfa Antibiotics Other (See Comments)    Childhood reaction   . Penicillins Rash    Childhood reaction     Family History  Problem Relation Age of Onset  . Heart disease Mother   . Heart disease Father   . Colon cancer Neg Hx       Prior to Admission medications   Medication Sig Start Date End Date Taking? Authorizing Provider  fluticasone (FLONASE) 50 MCG/ACT nasal spray Place 2 sprays into the nose daily as needed for allergies.    Yes Historical Provider, MD  metoCLOPramide (REGLAN) 5 MG/5ML solution Take 10 mLs (10 mg total) by mouth 3 (three) times daily before meals. 02/02/15  Yes Orvil Feil, NP  ondansetron (ZOFRAN ODT) 4 MG disintegrating tablet Take 1 tablet (4 mg total) by mouth every 8 (eight) hours as needed for  nausea. 01/23/15  Yes Carlis Stable, NP  pantoprazole (PROTONIX) 40 MG tablet Take 40 mg by mouth at bedtime.  12/04/14  Yes Historical Provider, MD  polyethylene glycol-electrolytes (NULYTELY/GOLYTELY) 420 G solution Take 4,000 mLs by mouth once. 01/23/15  Yes Carlis Stable, NP  promethazine (PHENERGAN) 12.5 MG tablet Take 1 tablet (12.5 mg total) by mouth every 6 (six) hours as needed for nausea or vomiting. 01/15/15  Yes Mahala Menghini, PA-C  ranitidine (ZANTAC) 300 MG tablet Take 300 mg by mouth 2 (two) times daily.  11/06/14  Yes Historical Provider, MD  topiramate (TOPAMAX) 25 MG tablet Take 25 mg by mouth at bedtime.  01/09/12  Yes Historical Provider, MD    Physical Exam: BP 128/61 mmHg  Pulse 72  Temp(Src) 97.3 F (36.3 C) (Oral)  Resp 18  Ht 5\' 6"  (1.676 m)  Wt 103 lb 4.8 oz (46.857 kg)  BMI 16.68 kg/m2  SpO2 100%  General:  NAD, thin Eyes: PERRL ENT: unremarkable Neck: supple, no JVD Cardiovascular: RRR Respiratory: CTABL Abdomen: soft/ND/ND, positive bowel sounds Skin: no rash Musculoskeletal:  No edema Psychiatric: calm/cooperative Neurologic: no focal findings            Labs on Admission:  Basic Metabolic Panel: No results for input(s): NA, K, CL, CO2, GLUCOSE, BUN, CREATININE, CALCIUM, MG, PHOS in the last 168 hours. Liver Function Tests: No results for input(s): AST,  ALT, ALKPHOS, BILITOT, PROT, ALBUMIN in the last 168 hours. No results for input(s): LIPASE, AMYLASE in the last 168 hours. No results for input(s): AMMONIA in the last 168 hours. CBC:  Recent Labs Lab 02/06/15 1605  WBC 5.5  NEUTROABS 3.8  HGB 10.8*  HCT 31.0*  MCV 97.5  PLT 158   Cardiac Enzymes: No results for input(s): CKTOTAL, CKMB, CKMBINDEX, TROPONINI in the last 168 hours.  BNP (last 3 results) No results for input(s): BNP in the last 8760 hours.  ProBNP (last 3 results) No results for input(s): PROBNP in the last 8760 hours.  CBG: No results for input(s): GLUCAP in the last 168  hours.  Radiological Exams on Admission: No results found.    Assessment/Plan Present on Admission:  . Gastric outlet obstruction  Gastric outlet obstruction: direct admission of GI. S/p EGD on 9/20. Per GI, npo, TPN, picc line placed on 9/20. General surgery consulted.  H/o migraine: patient want to try to take her oral meds. Ordered.  DVT prophylaxis: lovenox, need to hold lovenox the day of surgery  Consultants: Gi and general surgery  Code Status: full   Family Communication:  Patient and her sister at bedside  Disposition Plan: admit to med surg  Time spent: 29mins  Xu,Fang MD, PhD [facility] Pager [phone] If 7PM-7AM, please contact night-coverage at [email or web address], password [facility]

## 2015-02-06 NOTE — H&P (Signed)
  Primary Care Physician:  [name], MD Primary Gastroenterologist:  Dr. Oneida Alar  Pre-Procedure History & Physical: HPI:  Heidi Cooper is a 59 y.o. female here for NAUSEA/VOMTIING/EPIGASTRIC PAIN/UNINTENTIONAL WEIGHT LOSS.  Past Medical History  Diagnosis Date  . Migraine     Past Surgical History  Procedure Laterality Date  . Tonsillectomy      Prior to Admission medications   Medication Sig Start Date End Date Taking? Authorizing Provider  fluticasone (FLONASE) 50 MCG/ACT nasal spray Place 2 sprays into the nose daily as needed for allergies.    Yes Historical Provider, MD  metoCLOPramide (REGLAN) 5 MG/5ML solution Take 10 mLs (10 mg total) by mouth 3 (three) times daily before meals. 02/02/15  Yes Orvil Feil, NP  ondansetron (ZOFRAN ODT) 4 MG disintegrating tablet Take 1 tablet (4 mg total) by mouth every 8 (eight) hours as needed for nausea. 01/23/15  Yes Carlis Stable, NP  pantoprazole (PROTONIX) 40 MG tablet Take 40 mg by mouth at bedtime.  12/04/14  Yes Historical Provider, MD  polyethylene glycol-electrolytes (NULYTELY/GOLYTELY) 420 G solution Take 4,000 mLs by mouth once. 01/23/15  Yes Carlis Stable, NP  promethazine (PHENERGAN) 12.5 MG tablet Take 1 tablet (12.5 mg total) by mouth every 6 (six) hours as needed for nausea or vomiting. 01/15/15  Yes Mahala Menghini, PA-C  ranitidine (ZANTAC) 300 MG tablet Take 300 mg by mouth 2 (two) times daily.  11/06/14  Yes Historical Provider, MD  topiramate (TOPAMAX) 25 MG tablet Take 25 mg by mouth at bedtime.  01/09/12  Yes Historical Provider, MD    Allergies as of 01/23/2015 - Review Complete 01/23/2015  Allergen Reaction Noted  . Other Itching 01/21/2015  . Sulfa antibiotics Other (See Comments) 01/15/2015  . Penicillins Rash 01/15/2015    Family History  Problem Relation Age of Onset  . Heart disease Mother   . Heart disease Father   . Colon cancer Neg Hx     Social History   Social History  . Marital Status: Single   Spouse Name: N/A  . Number of Children: 0  . Years of Education: N/A   Occupational History  . [employer]     Social History Main Topics  . Smoking status: Former Smoker    Quit date: 11/27/2014  . Smokeless tobacco: Not on file  . Alcohol Use: No  . Drug Use: No  . Sexual Activity: Not on file   Other Topics Concern  . Not on file   Social History Narrative    Review of Systems: See HPI, otherwise negative ROS   Physical Exam: BP 124/62 mmHg  Pulse 72  Temp(Src) 98.1 F (36.7 C) (Oral)  Resp 18  SpO2 100% General:   Alert,  pleasant and cooperative in NAD Head:  Normocephalic and atraumatic. Neck:  Supple; Lungs:  Clear throughout to auscultation.    Heart:  Regular rate and rhythm. Abdomen:  Soft, nontender and nondistended. Normal bowel sounds, without guarding, and without rebound.   Neurologic:  Alert and  oriented x4;  grossly normal neurologically.  Impression/Plan:    NAUSEA/VOMTIING/EPIGASTRIC PAIN/UNINTENTIONAL WEIGHT LOSS  PLAN: EGD/TCS TODAY-DISCUSSED PROCEDURE, BENEFITS, & RISKS: < 1% chance of medication reaction, bleeding, perforation, or rupture of spleen/liver.

## 2015-02-06 NOTE — Progress Notes (Signed)
Peripherally Inserted Central Catheter/Midline Placement  The IV Nurse has discussed with the patient and/or persons authorized to consent for the patient, the purpose of this procedure and the potential benefits and risks involved with this procedure.  The benefits include less needle sticks, lab draws from the catheter and patient may be discharged home with the catheter.  Risks include, but not limited to, infection, bleeding, blood clot (thrombus formation), and puncture of an artery; nerve damage and irregular heat beat.  Alternatives to this procedure were also discussed.  PICC/Midline Placement Documentation  PICC / Midline Double Lumen 02/06/15 PICC Right Brachial 35 cm 0 cm (Active)  Indication for Insertion or Continuance of Line Administration of hyperosmolar/irritating solutions (i.e. TPN, Vancomycin, etc.) 02/06/2015  3:45 PM  Exposed Catheter (cm) 0 cm 02/06/2015  3:45 PM  Site Assessment Clean;Dry;Intact 02/06/2015  3:45 PM  Lumen #1 Status Flushed;Saline locked;Capped (Central line);Blood return noted 02/06/2015  3:45 PM  Lumen #2 Status Flushed;Saline locked;Capped (Central line);Blood return noted 02/06/2015  3:45 PM  Dressing Type Transparent 02/06/2015  3:45 PM  Dressing Status Clean;Dry;Intact 02/06/2015  3:45 PM  Dressing Change Due 02/13/15 02/06/2015  3:45 PM    picc ready to use per ECG technology   Roselind Messier 02/06/2015, 4:28 PM

## 2015-02-07 DIAGNOSIS — G43909 Migraine, unspecified, not intractable, without status migrainosus: Secondary | ICD-10-CM

## 2015-02-07 DIAGNOSIS — K219 Gastro-esophageal reflux disease without esophagitis: Secondary | ICD-10-CM

## 2015-02-07 DIAGNOSIS — K311 Adult hypertrophic pyloric stenosis: Secondary | ICD-10-CM

## 2015-02-07 DIAGNOSIS — E440 Moderate protein-calorie malnutrition: Secondary | ICD-10-CM | POA: Diagnosis present

## 2015-02-07 LAB — BASIC METABOLIC PANEL
BUN: 14 mg/dL (ref 6–20)
CALCIUM: 7.8 mg/dL — AB (ref 8.9–10.3)
CO2: 24 mmol/L (ref 22–32)
CREATININE: 0.66 mg/dL (ref 0.44–1.00)
Chloride: 112 mmol/L — ABNORMAL HIGH (ref 101–111)
Glucose, Bld: 122 mg/dL — ABNORMAL HIGH (ref 65–99)
Potassium: 3.5 mmol/L (ref 3.5–5.1)
SODIUM: 138 mmol/L (ref 135–145)

## 2015-02-07 LAB — CBC
HCT: 31.7 % — ABNORMAL LOW (ref 36.0–46.0)
HEMOGLOBIN: 10.9 g/dL — AB (ref 12.0–15.0)
MCH: 33.6 pg (ref 26.0–34.0)
MCHC: 34.4 g/dL (ref 30.0–36.0)
MCV: 97.8 fL (ref 78.0–100.0)
PLATELETS: 223 10*3/uL (ref 150–400)
RBC: 3.24 MIL/uL — ABNORMAL LOW (ref 3.87–5.11)
RDW: 13.6 % (ref 11.5–15.5)
WBC: 5.2 10*3/uL (ref 4.0–10.5)

## 2015-02-07 LAB — PHOSPHORUS: PHOSPHORUS: 3 mg/dL (ref 2.5–4.6)

## 2015-02-07 MED ORDER — TRACE MINERALS CR-CU-MN-SE-ZN 10-1000-500-60 MCG/ML IV SOLN
INTRAVENOUS | Status: AC
  Administered 2015-02-07: 18:00:00 via INTRAVENOUS
  Filled 2015-02-07: qty 960

## 2015-02-07 MED ORDER — FAT EMULSION 20 % IV EMUL
250.0000 mL | INTRAVENOUS | Status: AC
  Administered 2015-02-07: 250 mL via INTRAVENOUS
  Filled 2015-02-07: qty 250

## 2015-02-07 MED ORDER — INSULIN ASPART 100 UNIT/ML ~~LOC~~ SOLN
0.0000 [IU] | Freq: Four times a day (QID) | SUBCUTANEOUS | Status: DC
  Administered 2015-02-08 – 2015-02-09 (×2): 2 [IU] via SUBCUTANEOUS
  Administered 2015-02-10: 1 [IU] via SUBCUTANEOUS
  Administered 2015-02-10: 2 [IU] via SUBCUTANEOUS
  Administered 2015-02-10 (×2): 1 [IU] via SUBCUTANEOUS
  Administered 2015-02-11: 2 [IU] via SUBCUTANEOUS

## 2015-02-07 NOTE — Plan of Care (Addendum)
SPOKE WITH DR. Donato Heinz. STAINS & DIAGNOSIS STILL PENDING. WILL CONTACT SISTER AND PT WITH DIAGNOSIS WHEN AVAILABLE. RESULTS SHOULD BE BACK THIS AFTERNOON OR TOMORROW MORNING.

## 2015-02-07 NOTE — Progress Notes (Signed)
PARENTERAL NUTRITION CONSULT NOTE - INITIAL  Pharmacy Consult for TPN Indication: Gastric Outlet Obstruction  Allergies  Allergen Reactions  . Other Itching    Mycins cause itching   . Sulfa Antibiotics Other (See Comments)    Childhood reaction   . Penicillins Rash    Childhood reaction     Patient Measurements: Height: 5\' 6"  (167.6 cm) Weight: 103 lb 4.8 oz (46.857 kg) IBW/kg (Calculated) : 59.3 Usual Weight: 65kg  Vital Signs: Temp: 97.6 F (36.4 C) (09/21 1349) Temp Source: Oral (09/21 1349) BP: 98/65 mmHg (09/21 1349) Pulse Rate: 56 (09/21 1349) Intake/Output from previous day: 09/20 0701 - 09/21 0700 In: 2271.3 [I.V.:2271.3] Out: -  Intake/Output from this shift:    Labs:  Recent Labs  02/06/15 1605 02/07/15 0552  WBC 5.5 5.2  HGB 10.8* 10.9*  HCT 31.0* 31.7*  PLT 158 223  APTT 25  --   INR 1.29  --      Recent Labs  02/06/15 1605 02/07/15 0552  NA 139 138  K 3.9 3.5  CL 112* 112*  CO2 22 24  GLUCOSE 79 122*  BUN 20 14  CREATININE 0.70 0.66  CALCIUM 7.9* 7.8*  PHOS  --  3.0  PROT 4.8*  --   ALBUMIN 3.2*  --   AST 13*  --   ALT 10*  --   ALKPHOS 30*  --   BILITOT 0.7  --    Estimated Creatinine Clearance: 56.1 mL/min (by C-G formula based on Cr of 0.66).   No results for input(s): GLUCAP in the last 72 hours.  Medical History: Past Medical History  Diagnosis Date  . Migraine     Medications:  Prescriptions prior to admission  Medication Sig Dispense Refill Last Dose  . fluticasone (FLONASE) 50 MCG/ACT nasal spray Place 2 sprays into the nose daily as needed for allergies.    Past Week at Unknown time  . metoCLOPramide (REGLAN) 5 MG/5ML solution Take 10 mLs (10 mg total) by mouth 3 (three) times daily before meals. 120 mL 0 02/05/2015 at 0800  . ondansetron (ZOFRAN ODT) 4 MG disintegrating tablet Take 1 tablet (4 mg total) by mouth every 8 (eight) hours as needed for nausea. 30 tablet 0 02/05/2015 at 1200  . pantoprazole  (PROTONIX) 40 MG tablet Take 40 mg by mouth at bedtime.    02/05/2015 at 1200  . polyethylene glycol-electrolytes (NULYTELY/GOLYTELY) 420 G solution Take 4,000 mLs by mouth once. 4000 mL 0 02/05/2015 at 1800  . promethazine (PHENERGAN) 12.5 MG tablet Take 1 tablet (12.5 mg total) by mouth every 6 (six) hours as needed for nausea or vomiting. 20 tablet 0 02/05/2015 at 0800  . ranitidine (ZANTAC) 300 MG tablet Take 300 mg by mouth 2 (two) times daily.    02/05/2015 at Unknown time  . topiramate (TOPAMAX) 25 MG tablet Take 25 mg by mouth at bedtime.    02/05/2015 at 0800   Scheduled:  . enoxaparin (LOVENOX) injection  40 mg Subcutaneous Q24H  . fluticasone  2 spray Each Nare Daily  .  HYDROmorphone (DILAUDID) injection  1 mg Intravenous TID  . ondansetron (ZOFRAN) IV  4 mg Intravenous TID WC & HS  . pantoprazole (PROTONIX) IV  40 mg Intravenous BID AC  . sodium chloride  10-40 mL Intracatheter Q12H  . topiramate  25 mg Oral QHS    Insulin Requirements in the past 24 hours:  None  Current Nutrition:  Unable to tolerate PO due to Gastric  outlet obstruction over several months. NPO  Assessment: Inadequate oral intake related to inability to eat associated with GOO as evidenced by weight loss over 5 months.  Nutritional Goals:  1650-1850 kCal, 71-89 grams of protein per day  Plan: Initiate TPN at 28mls/hr today and advance as tolerated Monitor electrolytes and CBGs. Current TPN will  Provide:  CHO 490kcal/day  Fat    500 kcal/day  Prot   192 kcal/day or 48g/day Total calories 1182 kcal/day.  To advance as tolerates  Thanks for allowing [place] to participate in the care of this patient, [name], BS [place]ena [place], [place] Pharmacist Pager [phone]  02/07/2015,1:53 PM

## 2015-02-07 NOTE — Progress Notes (Signed)
[facility] PROGRESS NOTE  Heidi Cooper RSW:[id number] DOB: 16-Sep-1955 DOA: 02/06/2015 PCP: [name], MD  Assessment/Plan: 1. Gastric outlet obstruction, direct admission of GI, s/p EGD on 9/20. General surgery is planning operative management. GI following. Awaiting biopsy results from EGD. PICC line has been placeD for TPN.  2. Chronic migraines. Home medications. Continue Topamax  3. GERD.Continue PPI 4. Nausea and vomiting, resolved. 5. Hyperglycemia. Glucose mildly elevated. No history of diabetes.  Will check Hgb A1C.   Code Status: Full DVT prophylaxis: SCDs Family Communication: Family bedside. Discussed with patient who understands and has no concerns at this time. Disposition Plan: Discharge home    Consultants:  General surgery- Aviva Signs, MD  GI- Barney Drain, MD  Procedures:  EGD  Antibiotics:    HPI/Subjective: Feeling better after pain medications. Nausea and vomiting resolved. Nausea is chronic.   Objective: Filed Vitals:   02/07/15 0636  BP: 97/62  Pulse: 60  Temp: 97.8 F (36.6 C)  Resp: 16    Intake/Output Summary (Last 24 hours) at 02/07/15 0800 Last data filed at 02/07/15 0938  Gross per 24 hour  Intake 2271.25 ml  Output      0 ml  Net 2271.25 ml   Filed Weights   02/06/15 1658  Weight: 46.857 kg (103 lb 4.8 oz)    Exam:  General: NAD, looks comfortable  Cardiovascular: RRR, S1, S2   Respiratory: clear bilaterally, No wheezing, rales or rhnochi  Abdomen: soft, non tender, no distention  Musculoskeletal: No edema b/l  Data Reviewed: Basic Metabolic Panel:  Recent Labs Lab 02/06/15 1605 02/07/15 0552  NA 139 138  K 3.9 3.5  CL 112* 112*  CO2 22 24  GLUCOSE 79 122*  BUN 20 14  CREATININE 0.70 0.66  CALCIUM 7.9* 7.8*  PHOS  --  3.0   Liver Function Tests:  Recent Labs Lab 02/06/15 1605  AST 13*  ALT 10*  ALKPHOS 30*  BILITOT 0.7  PROT 4.8*  ALBUMIN 3.2*   CBC:  Recent Labs Lab  02/06/15 1605 02/07/15 0552  WBC 5.5 5.2  NEUTROABS 3.8  --   HGB 10.8* 10.9*  HCT 31.0* 31.7*  MCV 97.5 97.8  PLT 158 223    Scheduled Meds: . enoxaparin (LOVENOX) injection  40 mg Subcutaneous Q24H  . fluticasone  2 spray Each Nare Daily  .  HYDROmorphone (DILAUDID) injection  1 mg Intravenous TID  . ondansetron (ZOFRAN) IV  4 mg Intravenous TID WC & HS  . pantoprazole (PROTONIX) IV  40 mg Intravenous BID AC  . sodium chloride  10-40 mL Intracatheter Q12H  . topiramate  25 mg Oral QHS   Continuous Infusions: . dextrose 30 mL/hr at 02/06/15 1837  . dextrose 5 % and 0.9 % NaCl with KCl 20 mEq/L 75 mL/hr at 02/06/15 1829    Active Problems:   Gastric outlet obstruction    Time spent: 35 minutes     Kathie Dike, MD  [facility] Pager [phone]. If 7PM-7AM, please contact night-coverage at [email or web address], password [facility] 02/07/2015, 8:00 AM  LOS: 1 day     By signing my name below, I, Rennis Harding, attest that this documentation has been prepared under the direction and in the presence of Kathie Dike, MD. Electronically signed: Rennis Harding, Scribe. 02/07/2015  I, Dr. Kathie Dike, personally performed the services described in this documentaiton. All medical record entries made by the scribe were at my direction and in my presence. I have reviewed the chart and  agree that the record reflects my personal performance and is accurate and complete  Kathie Dike, MD, 02/07/2015 6:13 PM

## 2015-02-07 NOTE — Care Management Note (Signed)
Case Management Note  Patient Details  Name: Heidi Cooper MRN: [id number] Date of Birth: [birth year]  Subjective/Objective:                  Pt admitted from home with gastric outlet obs. Pt is normally independent with ADl's.  Action/Plan: Pt with surgical consult. Will continue to follow for discharge planning needs.  Expected Discharge Date:                  Expected Discharge Plan:  Home/Self Care  In-House Referral:  NA  Discharge planning Services  CM Consult  Post Acute Care Choice:  NA Choice offered to:  NA  DME Arranged:    DME Agency:     HH Arranged:    HH Agency:     Status of Service:  In process, will continue to follow  Medicare Important Message Given:    Date Medicare IM Given:    Medicare IM give by:    Date Additional Medicare IM Given:    Additional Medicare Important Message give by:     If discussed at [place] of Stay Meetings, dates discussed:    Additional Comments:  Joylene Draft, RN 02/07/2015, 2:53 PM

## 2015-02-07 NOTE — Consult Note (Signed)
Patient seen, chart reviewed. Full note to follow pending pathology results.

## 2015-02-07 NOTE — Progress Notes (Addendum)
Initial Nutrition Assessment  DOCUMENTATION CODES:  Underweight, Severe malnutrition in context of chronic illness  INTERVENTION:  TPN per pharmacy   Recommend MVI in bag due to prolonged poor intake w/o supplementation  NUTRITION DIAGNOSIS:  Inadequate oral intake related to inability to eat, vomiting, nausea, altered GI function as evidenced by wt loss of >25% bw in 5 months and estimated energy intake < or equal to 75% for > or equal to 1 month.  GOAL:  Patient will meet greater than or equal to 90% of their needs  MONITOR:  Diet advancement, Weight trends, I & O's, Labs, Work up  REASON FOR ASSESSMENT:  Malnutrition Screening Tool    ASSESSMENT:  59 y/o female w/ no significant PMHx presents with reported several month history of nausea vomiting, abdominal pain, 35 pound weight loss. Yesterday had EGD found to have gastric outlet obstruction. Per GI, npo, TPN, picc line placed on 9/20  Patient reports that her symptoms first started around March of this year. They slowly worsened to the point that  For the past 2.5-3 months she couldn't tolerate almost any PO intake. She reports that when she eats something it "sits in her stomach for a day or two" until she throws it back up. She has only been able to tolerate non-carbonated liquids and foods such as ice cream, jello, applesauce. She stopped taking her mvi a couple months ago. Given her poor intake w/o supplementation she is at risk for deficiencies.   Weight hx is limited. She states her normal weight is 143 lbs. She states the last time she weighed this much was in April. Per documentation,  She has lost 4.5% of her bw in 1 month alone and I find it completely feasible that she has lost as much as she states. Will assume accurate and diagnose with severe malnutrition.    Per MD, current plan (pathology report pending) is for pre op TPN prior to planned gastric bypass on Friday. Will continue to monitor work up and surgery plans.     NFPE: Moderate muscle/fat wasting  Diet Order:  Diet NPO time specified  Skin:  Reviewed, no issues  Last BM:  9/20  Height:  Ht Readings from Last 1 Encounters:  02/06/15 5\' 6"  (1.676 m)   Weight:  Wt Readings from Last 1 Encounters:  02/06/15 103 lb 4.8 oz (46.857 kg)   Wt Readings from Last 10 Encounters:  02/06/15 103 lb 4.8 oz (46.857 kg)  01/23/15 111 lb (50.349 kg)  01/21/15 108 lb (48.988 kg)  01/15/15 108 lb 9.6 oz (49.261 kg)   Ideal Body Weight:  59.1 kg  BMI:  Body mass index is 16.68 kg/(m^2).  Estimated Nutritional Needs:  Kcal:  1650-1850 kcals (35-40 kcal/kg) Protein:  71-89 g (1.2-1.5 g/kg bw) Fluid:  1.7-1.9 liters  EDUCATION NEEDS:  No education needs identified at this time  Burtis Junes RD, LDN Nutrition Pager: [phone] 02/07/2015 1:05 PM

## 2015-02-08 ENCOUNTER — Inpatient Hospital Stay (HOSPITAL_COMMUNITY): Payer: PRIVATE HEALTH INSURANCE

## 2015-02-08 ENCOUNTER — Encounter (HOSPITAL_COMMUNITY): Payer: Self-pay | Admitting: Gastroenterology

## 2015-02-08 ENCOUNTER — Telehealth: Payer: Self-pay | Admitting: Gastroenterology

## 2015-02-08 DIAGNOSIS — G43709 Chronic migraine without aura, not intractable, without status migrainosus: Secondary | ICD-10-CM | POA: Diagnosis present

## 2015-02-08 DIAGNOSIS — IMO0002 Reserved for concepts with insufficient information to code with codable children: Secondary | ICD-10-CM | POA: Diagnosis present

## 2015-02-08 DIAGNOSIS — E876 Hypokalemia: Secondary | ICD-10-CM

## 2015-02-08 DIAGNOSIS — K219 Gastro-esophageal reflux disease without esophagitis: Secondary | ICD-10-CM | POA: Diagnosis present

## 2015-02-08 LAB — PREALBUMIN: Prealbumin: 15.1 mg/dL — ABNORMAL LOW (ref 18–38)

## 2015-02-08 LAB — COMPREHENSIVE METABOLIC PANEL
ALK PHOS: 37 U/L — AB (ref 38–126)
ALT: 13 U/L — AB (ref 14–54)
AST: 20 U/L (ref 15–41)
Albumin: 3 g/dL — ABNORMAL LOW (ref 3.5–5.0)
Anion gap: 4 — ABNORMAL LOW (ref 5–15)
BILIRUBIN TOTAL: 0.3 mg/dL (ref 0.3–1.2)
BUN: 7 mg/dL (ref 6–20)
CALCIUM: 8.2 mg/dL — AB (ref 8.9–10.3)
CO2: 26 mmol/L (ref 22–32)
CREATININE: 0.73 mg/dL (ref 0.44–1.00)
Chloride: 110 mmol/L (ref 101–111)
Glucose, Bld: 90 mg/dL (ref 65–99)
Potassium: 3.2 mmol/L — ABNORMAL LOW (ref 3.5–5.1)
SODIUM: 140 mmol/L (ref 135–145)
TOTAL PROTEIN: 4.9 g/dL — AB (ref 6.5–8.1)

## 2015-02-08 LAB — GLUCOSE, CAPILLARY
GLUCOSE-CAPILLARY: 118 mg/dL — AB (ref 65–99)
GLUCOSE-CAPILLARY: 80 mg/dL (ref 65–99)
Glucose-Capillary: 157 mg/dL — ABNORMAL HIGH (ref 65–99)
Glucose-Capillary: 97 mg/dL (ref 65–99)

## 2015-02-08 LAB — HEMOGLOBIN A1C
HEMOGLOBIN A1C: 4.8 % (ref 4.8–5.6)
Mean Plasma Glucose: 91 mg/dL

## 2015-02-08 LAB — SURGICAL PCR SCREEN
MRSA, PCR: NEGATIVE
STAPHYLOCOCCUS AUREUS: NEGATIVE

## 2015-02-08 LAB — TRIGLYCERIDES: Triglycerides: 78 mg/dL (ref <150)

## 2015-02-08 LAB — PHOSPHORUS: PHOSPHORUS: 3.4 mg/dL (ref 2.5–4.6)

## 2015-02-08 LAB — MAGNESIUM: Magnesium: 2.1 mg/dL (ref 1.7–2.4)

## 2015-02-08 MED ORDER — CHLORHEXIDINE GLUCONATE 4 % EX LIQD
1.0000 "application " | Freq: Once | CUTANEOUS | Status: AC
  Administered 2015-02-08: 1 via TOPICAL
  Filled 2015-02-08: qty 15

## 2015-02-08 MED ORDER — CIPROFLOXACIN IN D5W 400 MG/200ML IV SOLN
400.0000 mg | INTRAVENOUS | Status: AC
  Administered 2015-02-09: 400 mg via INTRAVENOUS
  Filled 2015-02-08 (×2): qty 200

## 2015-02-08 MED ORDER — FAT EMULSION 20 % IV EMUL
250.0000 mL | INTRAVENOUS | Status: DC
  Administered 2015-02-08: 250 mL via INTRAVENOUS
  Filled 2015-02-08: qty 250

## 2015-02-08 MED ORDER — POTASSIUM CHLORIDE 10 MEQ/100ML IV SOLN
10.0000 meq | INTRAVENOUS | Status: AC
  Administered 2015-02-08 (×4): 10 meq via INTRAVENOUS
  Filled 2015-02-08: qty 100

## 2015-02-08 MED ORDER — TRACE MINERALS CR-CU-MN-SE-ZN 10-1000-500-60 MCG/ML IV SOLN
INTRAVENOUS | Status: DC
  Administered 2015-02-08: 19:00:00 via INTRAVENOUS
  Filled 2015-02-08: qty 1200

## 2015-02-08 NOTE — Plan of Care (Addendum)
Spoke to DR. Donato Heinz. 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 CALLED DR. Arnoldo Morale TO DISCUSS.   1230: SISTER AWARE OF RESULTS & WOULD LIKE TO BE HEAR WHEN SHE HEARS HER RESULTS. CHANGE TIME TO DISCUS FROM 1 PM TO 330 PM.

## 2015-02-08 NOTE — Telephone Encounter (Signed)
SISTER CALLED INQUIRING ABOUT BIOPSY RESULTS.  Heidi Cooper, [phone]

## 2015-02-08 NOTE — Telephone Encounter (Signed)
REVIEWED.PLEASE CALL PT'S SISTER-LESLIE. I WILL SEE PT BETWEEN 1230 AND AT 1 PM.

## 2015-02-08 NOTE — Telephone Encounter (Signed)
I returned call. Spoke to pt's sister, she said the pt is in the hospital. She said Dr. Oneida Alar had called and told them she hoped to have the results from biopsy on 02/06/2015 by today.

## 2015-02-08 NOTE — Progress Notes (Signed)
Biopsy of normal pylorus still pending. Patient noted to be hypokalemic and this is being addressed. Is scheduled for partial gastrectomy with gastrojejunostomy tomorrow. The risks and benefits of the procedure including bleeding, infection, anastomotic leak, and the possibility of a blood transfusion were fully explained to the patient, who gave informed consent.

## 2015-02-08 NOTE — Progress Notes (Signed)
Heidi Cooper NOTE  Pharmacy Consult for TPN Indication: Gastric Outlet Obstruction  Allergies  Allergen Reactions  . Other Itching    Mycins cause itching   . Sulfa Antibiotics Other (See Comments)    Childhood reaction   . Penicillins Rash    Childhood reaction    Patient Measurements: Height: 5\' 6"  (167.6 cm) Weight: 103 lb 4.8 oz (46.857 kg) IBW/kg (Calculated) : 59.3 Usual Weight: 65kg  Vital Signs: Temp: 98.5 F (36.9 C) (09/22 0618) Temp Source: Oral (09/22 0618) BP: 97/60 mmHg (09/22 0618) Pulse Rate: 61 (09/22 0618) Intake/Output from previous day: 09/21 0701 - 09/22 0700 In: 2252.3 [I.V.:1624; TPN:628.3] Out: -  Intake/Output from this shift:   Labs:  Recent Labs  02/06/15 1605 02/07/15 0552  WBC 5.5 5.2  HGB 10.8* 10.9*  HCT 31.0* 31.7*  PLT 158 223  APTT 25  --   INR 1.29  --     Recent Labs  02/06/15 1605 02/07/15 0552 02/08/15 0552  NA 139 138 140  K 3.9 3.5 3.2*  CL 112* 112* 110  CO2 22 24 26   GLUCOSE 79 122* 90  BUN 20 14 7   CREATININE 0.70 0.66 0.73  CALCIUM 7.9* 7.8* 8.2*  MG  --   --  2.1  PHOS  --  3.0 3.4  PROT 4.8*  --  4.9*  ALBUMIN 3.2*  --  3.0*  AST 13*  --  20  ALT 10*  --  13*  ALKPHOS 30*  --  37*  BILITOT 0.7  --  0.3  TRIG  --   --  78   Estimated Creatinine Clearance: 56.1 mL/min (by C-G formula based on Cr of 0.73).   Recent Labs  02/08/15 0045 02/08/15 0618  GLUCAP 157* 22    Medical History: Past Medical History  Diagnosis Date  . Migraine     Medications:  Prescriptions prior to admission  Medication Sig Dispense Refill Last Dose  . fluticasone (FLONASE) 50 MCG/ACT nasal spray Place 2 sprays into the nose daily as needed for allergies.    Past Week at Unknown time  . metoCLOPramide (REGLAN) 5 MG/5ML solution Take 10 mLs (10 mg total) by mouth 3 (three) times daily before meals. 120 mL 0 02/05/2015 at 0800  . ondansetron (ZOFRAN ODT) 4 MG disintegrating tablet Take 1 tablet (4 mg  total) by mouth every 8 (eight) hours as needed for nausea. 30 tablet 0 02/05/2015 at 1200  . pantoprazole (PROTONIX) 40 MG tablet Take 40 mg by mouth at bedtime.    02/05/2015 at 1200  . polyethylene glycol-electrolytes (NULYTELY/GOLYTELY) 420 G solution Take 4,000 mLs by mouth once. 4000 mL 0 02/05/2015 at 1800  . promethazine (PHENERGAN) 12.5 MG tablet Take 1 tablet (12.5 mg total) by mouth every 6 (six) hours as needed for nausea or vomiting. 20 tablet 0 02/05/2015 at 0800  . ranitidine (ZANTAC) 300 MG tablet Take 300 mg by mouth 2 (two) times daily.    02/05/2015 at Unknown time  . topiramate (TOPAMAX) 25 MG tablet Take 25 mg by mouth at bedtime.    02/05/2015 at 0800   Scheduled:  . enoxaparin (LOVENOX) injection  40 mg Subcutaneous Q24H  . fluticasone  2 spray Each Nare Daily  .  HYDROmorphone (DILAUDID) injection  1 mg Intravenous TID  . insulin aspart  0-9 Units Subcutaneous 4 times per day  . ondansetron (ZOFRAN) IV  4 mg Intravenous TID WC & HS  . pantoprazole (PROTONIX) IV  40 mg Intravenous BID AC  . sodium chloride  10-40 mL Intracatheter Q12H  . topiramate  25 mg Oral QHS    Insulin Requirements in the past 24 hours:  2 units via SSI  Current Nutrition:  Unable to tolerate PO due to Gastric outlet obstruction over several months. NPO  Assessment: Inadequate oral intake related to inability to eat associated with GOO as evidenced by weight loss over 5 months. Tolerating TPN, mild hypokalemia noted.  Calcium corrects to WNL when account for low albumin.  TGs pending.  Estimated Nutritional Needs:  Kcal: 1650-1850 kcals (35-40 kcal/kg) Protein: 71-89 g (1.2-1.5 g/kg bw) Fluid: 1.7-1.9 liters  Plan: Increase Clinimix E 5/15 to 50 ml/hr today and continue to advance as tolerated (goal 70 ml/hr) Add MVI and Trace Elements to TPN Lipids at 10 cc/hr. Monitor electrolytes and CBGs.  Thanks for allowing [place] to participate in the care of this patient, [name], BS Vena [place],  BCPS Clinical Pharmacist Pager [phone]  02/08/2015,8:48 AM

## 2015-02-08 NOTE — Telephone Encounter (Signed)
REVIEWED.  

## 2015-02-08 NOTE — Telephone Encounter (Signed)
I called and informed Magda Paganini. She said she is at home 2 hours away. She cannot be here by 12:30-1:00 today. She said if it is not bad news, she does not have to be here, but IF IT IS BAD NEWS,  SHE WANTS TO BE HERE WHEN PT IS TOLD. I told her I will let Dr. Oneida Alar know.

## 2015-02-08 NOTE — Telephone Encounter (Signed)
Per Dr. Oneida Alar, I called Magda Paganini and told her Dr. Oneida Alar will be there between 3:30-4:00 today and she can come on down.

## 2015-02-08 NOTE — Progress Notes (Signed)
[facility] PROGRESS NOTE  Heidi Cooper FKC:[id number] DOB: 1955/09/17 DOA: 02/06/2015 PCP: [name], MD  Assessment/Plan: 1. Gastric outlet obstruction, direct admission of GI, s/p EGD on 9/20. General surgery is planning operative management. GI following. Awaiting biopsy results from EGD. PICC line has been placeD for TPN.  2. Chronic migraines. Home medications. Will continue Topamax  3. GERD. Will continue PPI 4. Nausea and vomiting. Resolved. 5. Hyperglycemia. Glucose mildly elevated. No history of diabetes. Hgb A1c 4.8 9/21. Blood sugars are otherwise stable. 6. Hypokalemia. Replete.  Code Status: Full DVT prophylaxis: SCDs Family Communication: Family bedside. Discussed with patient who understands and has no concerns at this time. Disposition Plan: Discharge home    Consultants:  General surgery- Aviva Signs, MD  GI- Barney Drain, MD  Procedures:    Antibiotics:    HPI/Subjective: Feeling okay. Episode of vomiting after pain medication yesterday. No nausea, vomiting or abdominal pain today.  Objective: Filed Vitals:   02/08/15 0618  BP: 97/60  Pulse: 61  Temp: 98.5 F (36.9 C)  Resp: 18    Intake/Output Summary (Last 24 hours) at 02/08/15 1210 Last data filed at 02/08/15 0849  Gross per 24 hour  Intake 2262.34 ml  Output      0 ml  Net 2262.34 ml   Filed Weights   02/06/15 1658  Weight: 46.857 kg (103 lb 4.8 oz)    Exam:  General: NAD, looks comfortable  Cardiovascular: regular rate and rhythm, S1, S2   Respiratory: CTAB. No w/r/r  Abdomen: soft, ntnd  Musculoskeletal: No edema bilaterally  Data Reviewed: Basic Metabolic Panel:  Recent Labs Lab 02/06/15 1605 02/07/15 0552 02/08/15 0552  NA 139 138 140  K 3.9 3.5 3.2*  CL 112* 112* 110  CO2 22 24 26   GLUCOSE 79 122* 90  BUN 20 14 7   CREATININE 0.70 0.66 0.73  CALCIUM 7.9* 7.8* 8.2*  MG  --   --  2.1  PHOS  --  3.0 3.4   Liver Function Tests:  Recent  Labs Lab 02/06/15 1605 02/08/15 0552  AST 13* 20  ALT 10* 13*  ALKPHOS 30* 37*  BILITOT 0.7 0.3  PROT 4.8* 4.9*  ALBUMIN 3.2* 3.0*   CBC:  Recent Labs Lab 02/06/15 1605 02/07/15 0552  WBC 5.5 5.2  NEUTROABS 3.8  --   HGB 10.8* 10.9*  HCT 31.0* 31.7*  MCV 97.5 97.8  PLT 158 223    Scheduled Meds: . enoxaparin (LOVENOX) injection  40 mg Subcutaneous Q24H  . fluticasone  2 spray Each Nare Daily  .  HYDROmorphone (DILAUDID) injection  1 mg Intravenous TID  . insulin aspart  0-9 Units Subcutaneous 4 times per day  . ondansetron (ZOFRAN) IV  4 mg Intravenous TID WC & HS  . pantoprazole (PROTONIX) IV  40 mg Intravenous BID AC  . potassium chloride  10 mEq Intravenous Q1 Hr x 4  . sodium chloride  10-40 mL Intracatheter Q12H  . topiramate  25 mg Oral QHS   Continuous Infusions: . dextrose 30 mL/hr at 02/08/15 0434  . Marland KitchenTPN (CLINIMIX-E) Adult 40 mL/hr at 02/07/15 1826   And  . fat emulsion 250 mL (02/07/15 1826)  . Marland KitchenTPN (CLINIMIX-E) Adult     And  . fat emulsion      Active Problems:   Gastric outlet obstruction   Malnutrition of moderate degree    Time spent: 25 minutes     Kathie Dike, MD  [facility] Pager [phone]. If 7PM-7AM,  please contact night-coverage at [email or web address], password [facility] 02/08/2015, 12:10 PM  LOS: 2 days     By signing my name below, I, Rhett Bannister attest that this documentation has been prepared under the direction and in the presence of Kathie Dike, M.D.  Electronically signed: Rhett Bannister  02/08/2015   I, Dr. Kathie Dike, personally performed the services described in this documentaiton. All medical record entries made by the scribe were at my direction and in my presence. I have reviewed the chart and agree that the record reflects my personal performance and is accurate and complete  Kathie Dike, MD, 02/08/2015 12:10 PM

## 2015-02-09 ENCOUNTER — Encounter (HOSPITAL_COMMUNITY)
Admission: AD | Disposition: A | Discharge status: Home / Self Care | Payer: Self-pay | Source: Ambulatory Visit | Attending: General Surgery

## 2015-02-09 ENCOUNTER — Inpatient Hospital Stay (HOSPITAL_COMMUNITY): Payer: PRIVATE HEALTH INSURANCE | Admitting: Anesthesiology

## 2015-02-09 ENCOUNTER — Encounter (HOSPITAL_COMMUNITY): Payer: Self-pay | Admitting: Anesthesiology

## 2015-02-09 HISTORY — PX: GASTRECTOMY: SHX58

## 2015-02-09 LAB — BASIC METABOLIC PANEL
ANION GAP: 6 (ref 5–15)
BUN: 10 mg/dL (ref 6–20)
CALCIUM: 8.6 mg/dL — AB (ref 8.9–10.3)
CO2: 25 mmol/L (ref 22–32)
Chloride: 108 mmol/L (ref 101–111)
Creatinine, Ser: 0.79 mg/dL (ref 0.44–1.00)
Glucose, Bld: 96 mg/dL (ref 65–99)
POTASSIUM: 3.2 mmol/L — AB (ref 3.5–5.1)
Sodium: 139 mmol/L (ref 135–145)

## 2015-02-09 LAB — CBC WITH DIFFERENTIAL/PLATELET
Basophils Absolute: 0 10*3/uL (ref 0.0–0.1)
Basophils Relative: 0 %
EOS PCT: 6 %
Eosinophils Absolute: 0.3 10*3/uL (ref 0.0–0.7)
HEMATOCRIT: 34.1 % — AB (ref 36.0–46.0)
Hemoglobin: 11.7 g/dL — ABNORMAL LOW (ref 12.0–15.0)
LYMPHS PCT: 36 %
Lymphs Abs: 1.8 10*3/uL (ref 0.7–4.0)
MCH: 33.2 pg (ref 26.0–34.0)
MCHC: 34.3 g/dL (ref 30.0–36.0)
MCV: 96.9 fL (ref 78.0–100.0)
MONO ABS: 0.6 10*3/uL (ref 0.1–1.0)
MONOS PCT: 11 %
NEUTROS ABS: 2.4 10*3/uL (ref 1.7–7.7)
Neutrophils Relative %: 47 %
Platelets: 207 10*3/uL (ref 150–400)
RBC: 3.52 MIL/uL — ABNORMAL LOW (ref 3.87–5.11)
RDW: 13.7 % (ref 11.5–15.5)
WBC: 5 10*3/uL (ref 4.0–10.5)

## 2015-02-09 LAB — GLUCOSE, CAPILLARY
GLUCOSE-CAPILLARY: 91 mg/dL (ref 65–99)
GLUCOSE-CAPILLARY: 144 mg/dL — AB (ref 65–99)
Glucose-Capillary: 151 mg/dL — ABNORMAL HIGH (ref 65–99)
Glucose-Capillary: 173 mg/dL — ABNORMAL HIGH (ref 65–99)
Glucose-Capillary: 77 mg/dL (ref 65–99)
Glucose-Capillary: 91 mg/dL (ref 65–99)

## 2015-02-09 LAB — TYPE AND SCREEN
ABO/RH(D): A NEG
Antibody Screen: NEGATIVE

## 2015-02-09 SURGERY — GASTRECTOMY, TOTAL
Anesthesia: General | Site: Abdomen

## 2015-02-09 MED ORDER — LACTATED RINGERS IV SOLN
INTRAVENOUS | Status: DC
  Administered 2015-02-10 (×2): via INTRAVENOUS
  Administered 2015-02-11: 1 mL via INTRAVENOUS
  Administered 2015-02-13: 09:00:00 via INTRAVENOUS

## 2015-02-09 MED ORDER — SUCCINYLCHOLINE CHLORIDE 20 MG/ML IJ SOLN
INTRAMUSCULAR | Status: AC
  Filled 2015-02-09: qty 1

## 2015-02-09 MED ORDER — MIDAZOLAM HCL 2 MG/2ML IJ SOLN
INTRAMUSCULAR | Status: AC
  Filled 2015-02-09: qty 4

## 2015-02-09 MED ORDER — ROCURONIUM BROMIDE 100 MG/10ML IV SOLN
INTRAVENOUS | Status: DC | PRN
  Administered 2015-02-09: 25 mg via INTRAVENOUS
  Administered 2015-02-09: 5 mg via INTRAVENOUS

## 2015-02-09 MED ORDER — GLYCOPYRROLATE 0.2 MG/ML IJ SOLN
INTRAMUSCULAR | Status: DC | PRN
  Administered 2015-02-09: 0.6 mg via INTRAVENOUS

## 2015-02-09 MED ORDER — POTASSIUM CHLORIDE 10 MEQ/100ML IV SOLN
10.0000 meq | INTRAVENOUS | Status: DC
  Administered 2015-02-09 (×2): 10 meq via INTRAVENOUS
  Filled 2015-02-09 (×3): qty 100

## 2015-02-09 MED ORDER — LORAZEPAM 2 MG/ML IJ SOLN
1.0000 mg | INTRAMUSCULAR | Status: DC | PRN
  Administered 2015-02-09 – 2015-02-18 (×4): 1 mg via INTRAVENOUS
  Filled 2015-02-09 (×4): qty 1

## 2015-02-09 MED ORDER — LIDOCAINE HCL (PF) 1 % IJ SOLN
INTRAMUSCULAR | Status: AC
  Filled 2015-02-09: qty 5

## 2015-02-09 MED ORDER — PROPOFOL 10 MG/ML IV BOLUS
INTRAVENOUS | Status: DC | PRN
  Administered 2015-02-09: 90 mg via INTRAVENOUS

## 2015-02-09 MED ORDER — GENTAMICIN SULFATE 40 MG/ML IJ SOLN
INTRAMUSCULAR | Status: AC
  Filled 2015-02-09: qty 2

## 2015-02-09 MED ORDER — FENTANYL CITRATE (PF) 100 MCG/2ML IJ SOLN
INTRAMUSCULAR | Status: AC
  Filled 2015-02-09: qty 2

## 2015-02-09 MED ORDER — BUPIVACAINE LIPOSOME 1.3 % IJ SUSP
INTRAMUSCULAR | Status: DC | PRN
  Administered 2015-02-09: 20 mL

## 2015-02-09 MED ORDER — MIDAZOLAM HCL 2 MG/2ML IJ SOLN
INTRAMUSCULAR | Status: AC
  Filled 2015-02-09: qty 2

## 2015-02-09 MED ORDER — POVIDONE-IODINE 10 % EX OINT
TOPICAL_OINTMENT | CUTANEOUS | Status: AC
  Filled 2015-02-09: qty 1

## 2015-02-09 MED ORDER — PANTOPRAZOLE SODIUM 40 MG IV SOLR
40.0000 mg | Freq: Two times a day (BID) | INTRAVENOUS | Status: DC
  Administered 2015-02-10 – 2015-02-13 (×7): 40 mg via INTRAVENOUS
  Filled 2015-02-09 (×7): qty 40

## 2015-02-09 MED ORDER — MIDAZOLAM HCL 5 MG/5ML IJ SOLN
INTRAMUSCULAR | Status: DC | PRN
  Administered 2015-02-09: 2 mg via INTRAVENOUS

## 2015-02-09 MED ORDER — NEOSTIGMINE METHYLSULFATE 10 MG/10ML IV SOLN
INTRAVENOUS | Status: DC | PRN
  Administered 2015-02-09: 1 mg via INTRAVENOUS

## 2015-02-09 MED ORDER — METRONIDAZOLE IN NACL 5-0.79 MG/ML-% IV SOLN
INTRAVENOUS | Status: AC
  Filled 2015-02-09: qty 100

## 2015-02-09 MED ORDER — CIPROFLOXACIN IN D5W 400 MG/200ML IV SOLN
INTRAVENOUS | Status: AC
  Filled 2015-02-09: qty 200

## 2015-02-09 MED ORDER — SODIUM CHLORIDE 0.9 % IV BOLUS (SEPSIS)
1000.0000 mL | Freq: Once | INTRAVENOUS | Status: AC
  Administered 2015-02-09: 1000 mL via INTRAVENOUS

## 2015-02-09 MED ORDER — POVIDONE-IODINE 10 % OINT PACKET
TOPICAL_OINTMENT | CUTANEOUS | Status: DC | PRN
  Administered 2015-02-09: 2 via TOPICAL

## 2015-02-09 MED ORDER — LIDOCAINE HCL 1 % IJ SOLN
INTRAMUSCULAR | Status: DC | PRN
  Administered 2015-02-09: 20 mg via INTRADERMAL

## 2015-02-09 MED ORDER — ACETAMINOPHEN 650 MG RE SUPP: 650.0000 mg | Freq: Four times a day (QID) | RECTAL | Status: DC | PRN

## 2015-02-09 MED ORDER — FENTANYL CITRATE (PF) 100 MCG/2ML IJ SOLN
INTRAMUSCULAR | Status: AC
  Filled 2015-02-09: qty 4

## 2015-02-09 MED ORDER — ACETAMINOPHEN 325 MG PO TABS: 650.0000 mg | ORAL_TABLET | Freq: Four times a day (QID) | ORAL | Status: DC | PRN

## 2015-02-09 MED ORDER — SUCCINYLCHOLINE CHLORIDE 20 MG/ML IJ SOLN
INTRAMUSCULAR | Status: DC | PRN
  Administered 2015-02-09: 140 mg via INTRAVENOUS

## 2015-02-09 MED ORDER — LACTATED RINGERS IV SOLN
INTRAVENOUS | Status: DC
  Administered 2015-02-09: 1000 mL via INTRAVENOUS

## 2015-02-09 MED ORDER — ONDANSETRON HCL 4 MG/2ML IJ SOLN
4.0000 mg | Freq: Once | INTRAMUSCULAR | Status: AC | PRN
  Administered 2015-02-09: 4 mg via INTRAVENOUS
  Filled 2015-02-09: qty 2

## 2015-02-09 MED ORDER — METRONIDAZOLE IN NACL 5-0.79 MG/ML-% IV SOLN
INTRAVENOUS | Status: DC | PRN
  Administered 2015-02-09: 500 mg via INTRAVENOUS

## 2015-02-09 MED ORDER — FENTANYL CITRATE (PF) 100 MCG/2ML IJ SOLN
INTRAMUSCULAR | Status: DC | PRN
  Administered 2015-02-09: 50 ug via INTRAVENOUS
  Administered 2015-02-09: 75 ug via INTRAVENOUS
  Administered 2015-02-09 (×3): 50 ug via INTRAVENOUS
  Administered 2015-02-09: 75 ug via INTRAVENOUS

## 2015-02-09 MED ORDER — MIDAZOLAM HCL 2 MG/2ML IJ SOLN
1.0000 mg | INTRAMUSCULAR | Status: DC | PRN
  Administered 2015-02-09: 2 mg via INTRAVENOUS

## 2015-02-09 MED ORDER — FENTANYL CITRATE (PF) 100 MCG/2ML IJ SOLN
25.0000 ug | INTRAMUSCULAR | Status: DC | PRN
  Administered 2015-02-09 (×4): 50 ug via INTRAVENOUS

## 2015-02-09 MED ORDER — 0.9 % SODIUM CHLORIDE (POUR BTL) OPTIME
TOPICAL | Status: DC | PRN
  Administered 2015-02-09: 1000 mL

## 2015-02-09 MED ORDER — HYDROMORPHONE HCL 1 MG/ML IJ SOLN
1.0000 mg | INTRAMUSCULAR | Status: DC | PRN
  Administered 2015-02-09 – 2015-02-18 (×38): 1 mg via INTRAVENOUS
  Filled 2015-02-09 (×39): qty 1

## 2015-02-09 MED ORDER — SODIUM CHLORIDE 0.9 % IV BOLUS (SEPSIS): 500.0000 mL | Freq: Once | INTRAVENOUS | Status: DC

## 2015-02-09 MED ORDER — BUPIVACAINE LIPOSOME 1.3 % IJ SUSP
INTRAMUSCULAR | Status: AC
  Filled 2015-02-09: qty 20

## 2015-02-09 MED ORDER — TRACE MINERALS CR-CU-MN-SE-ZN 10-1000-500-60 MCG/ML IV SOLN
INTRAVENOUS | Status: AC
  Administered 2015-02-09: 19:00:00 via INTRAVENOUS
  Filled 2015-02-09: qty 1680

## 2015-02-09 MED ORDER — FENTANYL CITRATE (PF) 250 MCG/5ML IJ SOLN
INTRAMUSCULAR | Status: AC
  Filled 2015-02-09: qty 25

## 2015-02-09 MED ORDER — DEXTROSE 5 % IV SOLN
INTRAVENOUS | Status: DC | PRN
  Administered 2015-02-09: 12:00:00 via INTRAVENOUS

## 2015-02-09 MED ORDER — FAT EMULSION 20 % IV EMUL
250.0000 mL | INTRAVENOUS | Status: AC
  Administered 2015-02-09: 250 mL via INTRAVENOUS
  Filled 2015-02-09: qty 250

## 2015-02-09 SURGICAL SUPPLY — 69 items
BAG HAMPER (MISCELLANEOUS) ×3 IMPLANT
CELLS DAT CNTRL 66122 CELL SVR (MISCELLANEOUS) IMPLANT
CLOTH BEACON ORANGE TIMEOUT ST (SAFETY) ×3 IMPLANT
COVER LIGHT HANDLE STERIS (MISCELLANEOUS) ×6 IMPLANT
DRAIN PENROSE 18X1/2 LTX STRL (DRAIN) IMPLANT
DRAPE WARM FLUID 44X44 (DRAPE) IMPLANT
DRSG OPSITE POSTOP 4X8 (GAUZE/BANDAGES/DRESSINGS) ×3 IMPLANT
DURAPREP 26ML APPLICATOR (WOUND CARE) ×3 IMPLANT
ELECT BLADE 6 FLAT ULTRCLN (ELECTRODE) IMPLANT
ELECT REM PT RETURN 9FT ADLT (ELECTROSURGICAL) ×3
ELECTRODE REM PT RTRN 9FT ADLT (ELECTROSURGICAL) ×1 IMPLANT
EVACUATOR DRAINAGE 10X20 100CC (DRAIN) ×1 IMPLANT
EVACUATOR SILICONE 100CC (DRAIN) ×2
GAUZE SPONGE 4X4 12PLY STRL (GAUZE/BANDAGES/DRESSINGS) IMPLANT
GLOVE BIOGEL M 7.0 STRL (GLOVE) ×3 IMPLANT
GLOVE BIOGEL PI IND STRL 7.0 (GLOVE) ×3 IMPLANT
GLOVE BIOGEL PI INDICATOR 7.0 (GLOVE) ×6
GLOVE ECLIPSE 6.5 STRL STRAW (GLOVE) ×3 IMPLANT
GLOVE EXAM NITRILE MD LF STRL (GLOVE) ×3 IMPLANT
GLOVE SURG SS PI 7.5 STRL IVOR (GLOVE) ×3 IMPLANT
GOWN STRL REUS W/ TWL XL LVL3 (GOWN DISPOSABLE) ×1 IMPLANT
GOWN STRL REUS W/TWL LRG LVL3 (GOWN DISPOSABLE) ×9 IMPLANT
GOWN STRL REUS W/TWL XL LVL3 (GOWN DISPOSABLE) ×2
HARMONIC SHEARS 14CM COAG (MISCELLANEOUS) IMPLANT
HEMOSTAT SURGICEL 4X8 (HEMOSTASIS) IMPLANT
INST SET MAJOR GENERAL (KITS) ×3 IMPLANT
KIT ROOM TURNOVER APOR (KITS) ×3 IMPLANT
LIGASURE IMPACT 36 18CM CVD LR (INSTRUMENTS) ×3 IMPLANT
MANIFOLD NEPTUNE II (INSTRUMENTS) ×3 IMPLANT
NEEDLE HYPO 18GX1.5 BLUNT FILL (NEEDLE) IMPLANT
NEEDLE HYPO 21X1 ECLIPSE (NEEDLE) ×3 IMPLANT
NS IRRIG 1000ML POUR BTL (IV SOLUTION) ×3 IMPLANT
PACK ABDOMINAL MAJOR (CUSTOM PROCEDURE TRAY) ×3 IMPLANT
PAD ABD 5X9 TENDERSORB (GAUZE/BANDAGES/DRESSINGS) IMPLANT
PAD ARMBOARD 7.5X6 YLW CONV (MISCELLANEOUS) ×3 IMPLANT
RELOAD LINEAR CUT PROX 55 BLUE (ENDOMECHANICALS) IMPLANT
RELOAD PROXIMATE 75MM BLUE (ENDOMECHANICALS) ×15 IMPLANT
RETAINER VISCERA MED (MISCELLANEOUS) IMPLANT
RETRACTOR WND ALEXIS 25 LRG (MISCELLANEOUS) ×1 IMPLANT
RETRACTOR WOUND ALXS 34CM XLRG (MISCELLANEOUS) IMPLANT
RTRCTR WOUND ALEXIS 18CM MED (MISCELLANEOUS)
RTRCTR WOUND ALEXIS 25CM LRG (MISCELLANEOUS) ×3
RTRCTR WOUND ALEXIS 34CM XLRG (MISCELLANEOUS)
SET BASIN LINEN APH (SET/KITS/TRAYS/PACK) ×3 IMPLANT
SPONGE DRAIN TRACH 4X4 STRL 2S (GAUZE/BANDAGES/DRESSINGS) ×3 IMPLANT
SPONGE GAUZE 4X4 12PLY (GAUZE/BANDAGES/DRESSINGS) ×3 IMPLANT
SPONGE INTESTINAL PEANUT (DISPOSABLE) ×3 IMPLANT
SPONGE LAP 18X18 X RAY DECT (DISPOSABLE) IMPLANT
STAPLER GUN LINEAR PROX 60 (STAPLE) ×9 IMPLANT
STAPLER PROXIMATE 55 BLUE (STAPLE) IMPLANT
STAPLER PROXIMATE 75MM BLUE (STAPLE) ×3 IMPLANT
STAPLER PROXIMATE 90 DIAL (STAPLE) ×3 IMPLANT
STAPLER ROTICULATOR 4.8 (STAPLE) IMPLANT
SUCTION POOLE TIP (SUCTIONS) ×3 IMPLANT
SUT ETHILON 3 0 FSL (SUTURE) ×3 IMPLANT
SUT NOVA NAB GS-26 0 60 (SUTURE) IMPLANT
SUT PDS AB CT VIOLET #0 27IN (SUTURE) ×6 IMPLANT
SUT PROLENE 2 0 SH 30 (SUTURE) ×6 IMPLANT
SUT PROLENE NAB BLUE 3-0 30IN (SUTURE) ×3 IMPLANT
SUT SILK 2 0 (SUTURE)
SUT SILK 2 0 SH (SUTURE) ×3 IMPLANT
SUT SILK 2-0 18XBRD TIE 12 (SUTURE) IMPLANT
SUT SILK 3 0 SH CR/8 (SUTURE) ×3 IMPLANT
SYR 20CC LL (SYRINGE) ×6 IMPLANT
SYR 5ML LL (SYRINGE) IMPLANT
TAPE CLOTH SURG 4X10 WHT LF (GAUZE/BANDAGES/DRESSINGS) ×3 IMPLANT
TOWEL BLUE STERILE X RAY DET (MISCELLANEOUS) ×3 IMPLANT
TOWEL OR 17X26 4PK STRL BLUE (TOWEL DISPOSABLE) IMPLANT
TRAY FOLEY CATH SILVER 16FR (SET/KITS/TRAYS/PACK) ×3 IMPLANT

## 2015-02-09 NOTE — Op Note (Signed)
Patient:  Heidi Cooper  DOB:  Oct 15, 1955  MRN:  [id number]   Preop Diagnosis:  Gastric outlet obstruction, gastric carcinoma  Postop Diagnosis:  Same  Procedure:  Partial gastrectomy (distal antrectomy) with Roux-en-Y gastrojejunostomy, right hemicolectomy  Surgeon:  Aviva Signs, M.D.  Anes:  Gen. endotracheal  Indications:  Patient is a 59 year old white female with a significant weight loss over the past few months was found on EGD to have a gastric outlet obstruction due to narrowing of the pylorus. 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 The patient now comes to the operating room for a distal antrectomy with Roux-en-Y gastrojejunostomy. The risks and benefits of the procedure including bleeding, infection, cardiopulmonary difficulties, anastomotic leak, and the possibility of a blood transfusion were fully explained to the patient, who gave informed consent.  Procedure note:  The patient is placed the supine position. After induction of general endotracheal anesthesia, the abdomen was prepped and draped using usual sterile technique with DuraPrep. Surgical site confirmation was performed.  A midline incision was made and the xiphoid process to the umbilicus. The peritoneal cavity was entered into without difficulty. The liver was inspected and noted to be within normal limits. The gallbladder was within normal limits. At the pylorus, a mass was palpable. It was somewhat mobile, though it was in close proximity to the base of the mesentery of the transverse colon. Grossly normal margin was noted distal to this at the distal portion of the duodenal bulb. The superior and inferior gastroduodenal vessels were divided using the LigaSure. A TA-30 stapler was placed distal to the gastric mass and fired. This was proximal to the second portion of the duodenum. The duodenal stump was oversewn using 3-0 silk sutures. The dissection was then carried up the lesser sac as well as the greater sac  including the omentum up to the proximal portion of the antrum. A TA 90 stapler was then placed across this and the stomach was divided. During the dissection, it was noted that the middle colic artery had to be sacrificed. The specimen was removed from the operative field and sent to pathology further examination. The gastric staple line was oversewn using a 200 proline running suture. Next, a Roux limb of jejunum was then advanced up to the stomach into a retrocolic fashion after being divided in the midportion of the jejunum using a GIA stapler. A side-to-side gastrojejunostomy was performed using a GIA 70 stapler along the posterior greater curvature of the remaining stomach. The enterotomy was closed using a TA 60 stapler. The enteroenterostomy was then completed in a side-to-side fashion using a GIA-75 stapler. The enterotomy was closed using a TA 60 stapler. Omentum around the gastrojejunostomy was used to cover the anastomosis. The nasogastric tube was noted in appropriate position just proximal to this anastomosis.  The transverse colon was inspected and noted to be somewhat dusky in nature. This seemed to extend around the right hepatic flexure. I elected to proceed with a right hemicolectomy due to the ischemic compromise. A GIA stapler was placed across the terminal ileum and fired. The was likewise done at the distal transverse colon where the tissue looked healthy. The mesentery of the right colon was then divided using the LigaSure. The specimen was sent to pathology further examination. A side to side ileocolic anastomosis was performed using a GIA 70 stapler. The staple line was bolstered using 3-0 silk sutures. The enterotomy was closed using a TA 60 stapler. Surrounding omentum was placed over the anastomosis. The abdominal  cavity was copiously irrigated with normal saline. A #10 flat Jackson-Pratt drain was placed around the duodenal stump and towards the gastrojejunostomy. It was secured at  the skin level using a 3-0 nylon interrupted suture. The fascia was reapproximated using an 0 PDS running suture. The subcutaneous layer was irrigated normal saline. Exparel was instilled into the surrounding wound. Staples were then applied. Betadine ointment and dry sterile dressing were applied.  All tape and needle counts were correct at the end of the procedure. Patient was extubated in the operating room and transferred to PACU in stable condition.    Complications:  None  EBL:  150 mL  Specimen:  Antrum, right colon  Drains:  JP drain to duodenal stump and gastrojejunostomy

## 2015-02-09 NOTE — Progress Notes (Addendum)
Patient refused IS, IS in room

## 2015-02-09 NOTE — Plan of Care (Signed)
Problem: Phase I Progression Outcomes Goal: Pain controlled with appropriate interventions Outcome: Progressing Pt has been taking her pain medicine to control pain.  Goal: OOB as tolerated unless otherwise ordered Outcome: Progressing Pt ambulates to the bathroom.

## 2015-02-09 NOTE — Progress Notes (Signed)
[place] NOTE  Pharmacy Consult for TPN Indication: Gastric Outlet Obstruction  Allergies  Allergen Reactions  . Other Itching    Mycins cause itching   . Sulfa Antibiotics Other (See Comments)    Childhood reaction   . Penicillins Rash    Childhood reaction    Patient Measurements: Height: 5\' 6"  (167.6 cm) Weight: 103 lb 4.8 oz (46.857 kg) IBW/kg (Calculated) : 59.3 Usual Weight: 65kg  Vital Signs: Temp: 97.9 F (36.6 C) (09/23 1105) Temp Source: Oral (09/23 1105) BP: 132/83 mmHg (09/23 1130) Pulse Rate: 66 (09/23 1105) Intake/Output from previous day: 09/22 0701 - 09/23 0700 In: 10 [I.V.:10] Out: -  Intake/Output from this shift: Total I/O In: 600 [I.V.:600] Out: 730 [Urine:30; Other:700] Labs:  Recent Labs  02/06/15 1605 02/07/15 0552 02/09/15 0633  WBC 5.5 5.2 5.0  HGB 10.8* 10.9* 11.7*  HCT 31.0* 31.7* 34.1*  PLT 158 223 207  APTT 25  --   --   INR 1.29  --   --     Recent Labs  02/06/15 1605 02/07/15 0552 02/08/15 0552 02/08/15 0557 02/09/15 0633  NA 139 138 140  --  139  K 3.9 3.5 3.2*  --  3.2*  CL 112* 112* 110  --  108  CO2 22 24 26   --  25  GLUCOSE 79 122* 90  --  96  BUN 20 14 7   --  10  CREATININE 0.70 0.66 0.73  --  0.79  CALCIUM 7.9* 7.8* 8.2*  --  8.6*  MG  --   --  2.1  --   --   PHOS  --  3.0 3.4  --   --   PROT 4.8*  --  4.9*  --   --   ALBUMIN 3.2*  --  3.0*  --   --   AST 13*  --  20  --   --   ALT 10*  --  13*  --   --   ALKPHOS 30*  --  37*  --   --   BILITOT 0.7  --  0.3  --   --   PREALBUMIN  --   --   --  15.1*  --   TRIG  --   --  78  --   --    Estimated Creatinine Clearance: 56.1 mL/min (by C-G formula based on Cr of 0.79).   Recent Labs  02/09/15 0038 02/09/15 0615 02/09/15 1058  GLUCAP 91 77 91    Medical History: Past Medical History  Diagnosis Date  . Migraine     Medications:  Prescriptions prior to admission  Medication Sig Dispense Refill Last Dose  . fluticasone  (FLONASE) 50 MCG/ACT nasal spray Place 2 sprays into the nose daily as needed for allergies.    Past Week at Unknown time  . metoCLOPramide (REGLAN) 5 MG/5ML solution Take 10 mLs (10 mg total) by mouth 3 (three) times daily before meals. 120 mL 0 02/05/2015 at 0800  . ondansetron (ZOFRAN ODT) 4 MG disintegrating tablet Take 1 tablet (4 mg total) by mouth every 8 (eight) hours as needed for nausea. 30 tablet 0 02/05/2015 at 1200  . pantoprazole (PROTONIX) 40 MG tablet Take 40 mg by mouth at bedtime.    02/05/2015 at 1200  . polyethylene glycol-electrolytes (NULYTELY/GOLYTELY) 420 G solution Take 4,000 mLs by mouth once. 4000 mL 0 02/05/2015 at 1800  . promethazine (PHENERGAN) 12.5 MG tablet Take 1 tablet (12.5  mg total) by mouth every 6 (six) hours as needed for nausea or vomiting. 20 tablet 0 02/05/2015 at 0800  . ranitidine (ZANTAC) 300 MG tablet Take 300 mg by mouth 2 (two) times daily.    02/05/2015 at Unknown time  . topiramate (TOPAMAX) 25 MG tablet Take 25 mg by mouth at bedtime.    02/05/2015 at 0800   Scheduled:  . enoxaparin (LOVENOX) injection  40 mg Subcutaneous Q24H  . fluticasone  2 spray Each Nare Daily  .  HYDROmorphone (DILAUDID) injection  1 mg Intravenous TID  . insulin aspart  0-9 Units Subcutaneous 4 times per day  . ondansetron (ZOFRAN) IV  4 mg Intravenous TID WC & HS  . pantoprazole (PROTONIX) IV  40 mg Intravenous BID AC  . sodium chloride  10-40 mL Intracatheter Q12H  . topiramate  25 mg Oral QHS   Insulin Requirements in the past 24 hours:  0  Current Nutrition:  Unable to tolerate PO due to Gastric outlet obstruction over several months. NPO  Assessment: Inadequate oral intake related to inability to eat associated with GOO as evidenced by weight loss over 5 months.  Dietician note reviewed.  Tolerating TPN, mild hypokalemia noted.  Calcium corrects to WNL when account for low albumin.  TGs OK.   Estimated Nutritional Needs:  Kcal: 1650-1850 kcals (35-40  kcal/kg) Protein: 71-89 g (1.2-1.5 g/kg bw) Fluid: 1.7-1.9 liters  Plan: Increase Clinimix E 5/15 to 70 ml/hr today (goal rate)  Add MVI and Trace Elements to TPN Lipids at 10 cc/hr with TPN Replenish K+ w/ KCL runs Monitor electrolytes and CBGs, fluid and glucose tolerance.  Thanks for allowing [place] to participate in the care of this patient, Hart Robinsons, PharmD  Clinical Pharmacist Pager [phone]  02/09/2015,1:18 PM

## 2015-02-09 NOTE — Anesthesia Postprocedure Evaluation (Signed)
  Anesthesia Post-op Note  Patient: Heidi Cooper  Procedure(s) Performed: Procedure(s): DISTAL GASTRECTOMY WITH GASTROJEJUNOSTOMY; RIGHT HEMICOLECTOMY (N/A)  Patient Location: PACU  Anesthesia Type:General  Level of Consciousness: awake, alert  and patient cooperative  Airway and Oxygen Therapy: Patient Spontanous Breathing and Patient connected to face mask oxygen  Post-op Pain: 6 /10, moderate  Post-op Assessment: Post-op Vital signs reviewed, Patient's Cardiovascular Status Stable, Respiratory Function Stable, Patent Airway and No signs of Nausea or vomiting              Post-op Vital Signs: Reviewed and stable  Last Vitals:  Filed Vitals:   02/09/15 1415  BP: 128/77  Pulse: 74  Temp: 36.4 C  Resp: 24    Complications: No apparent anesthesia complications

## 2015-02-09 NOTE — Anesthesia Procedure Notes (Signed)
Procedure Name: Intubation Date/Time: 02/09/2015 12:00 PM Performed by: Charmaine Downs Pre-anesthesia Checklist: Patient being monitored, Suction available, Emergency Drugs available and Patient identified Patient Re-evaluated:Patient Re-evaluated prior to inductionOxygen Delivery Method: Circle system utilized Preoxygenation: Pre-oxygenation with 100% oxygen Intubation Type: IV induction, Rapid sequence and Cricoid Pressure applied Ventilation: Mask ventilation without difficulty Laryngoscope Size: Mac and 3 Grade View: Grade II Tube type: Oral Tube size: 7.0 mm Number of attempts: 1 Airway Equipment and Method: Stylet and Oral airway Placement Confirmation: ETT inserted through vocal cords under direct vision,  positive ETCO2 and breath sounds checked- equal and bilateral Secured at: 22 cm Tube secured with: Tape Dental Injury: Teeth and Oropharynx as per pre-operative assessment

## 2015-02-09 NOTE — Transfer of Care (Signed)
Immediate Anesthesia Transfer of Care Note  Patient: Heidi Cooper  Procedure(s) Performed: Procedure(s): DISTAL GASTRECTOMY WITH GASTROJEJUNOSTOMY; RIGHT HEMICOLECTOMY (N/A)  Patient Location: PACU  Anesthesia Type:General  Level of Consciousness: awake and patient cooperative  Airway & Oxygen Therapy: Patient Spontanous Breathing and Patient connected to face mask oxygen  Post-op Assessment: Report given to RN, Post -op Vital signs reviewed and stable and Patient moving all extremities  Post vital signs: Reviewed and stable  Last Vitals:  Filed Vitals:   02/09/15 1130  BP: 132/83  Pulse:   Temp:   Resp: 16    Complications: No apparent anesthesia complications

## 2015-02-09 NOTE — Anesthesia Preprocedure Evaluation (Addendum)
Anesthesia Evaluation  Patient identified by MRN, date of birth, ID band  Reviewed: Allergy & Precautions, NPO status , Patient's Chart, lab work & pertinent test results  Airway Mallampati: I  TM Distance: >3 FB Neck ROM: Full    Dental  (+) Teeth Intact,    Pulmonary former smoker (Quit 11/2014; former 40+ pack year history),    Pulmonary exam normal breath sounds clear to auscultation       Cardiovascular negative cardio ROS Normal cardiovascular exam Rhythm:Regular Rate:Normal     Neuro/Psych  Headaches,    GI/Hepatic Neg liver ROS, GERD  Medicated and Controlled,  Endo/Other  negative endocrine ROS  Renal/GU negative Renal ROS  negative genitourinary   Musculoskeletal negative musculoskeletal ROS (+)   Abdominal Normal abdominal exam  (+)   Peds  Hematology negative hematology ROS (+)   Anesthesia Other Findings   Reproductive/Obstetrics negative OB ROS                            Anesthesia Physical Anesthesia Plan  ASA: II  Anesthesia Plan: General   Post-op Pain Management:    Induction: Intravenous  Airway Management Planned: Oral ETT  Additional Equipment:   Intra-op Plan:   Post-operative Plan: Extubation in OR  Informed Consent: I have reviewed the patients History and Physical, chart, labs and discussed the procedure including the risks, benefits and alternatives for the proposed anesthesia with the patient or authorized representative who has indicated his/her understanding and acceptance.   Dental advisory given  Plan Discussed with: CRNA and Surgeon  Anesthesia Plan Comments:         Anesthesia Quick Evaluation

## 2015-02-10 LAB — BASIC METABOLIC PANEL
Anion gap: 3 — ABNORMAL LOW (ref 5–15)
BUN: 13 mg/dL (ref 6–20)
CO2: 24 mmol/L (ref 22–32)
CREATININE: 0.64 mg/dL (ref 0.44–1.00)
Calcium: 7.6 mg/dL — ABNORMAL LOW (ref 8.9–10.3)
Chloride: 105 mmol/L (ref 101–111)
Glucose, Bld: 151 mg/dL — ABNORMAL HIGH (ref 65–99)
Potassium: 3.5 mmol/L (ref 3.5–5.1)
SODIUM: 132 mmol/L — AB (ref 135–145)

## 2015-02-10 LAB — CBC
HCT: 33.8 % — ABNORMAL LOW (ref 36.0–46.0)
Hemoglobin: 11.7 g/dL — ABNORMAL LOW (ref 12.0–15.0)
MCH: 33.3 pg (ref 26.0–34.0)
MCHC: 34.6 g/dL (ref 30.0–36.0)
MCV: 96.3 fL (ref 78.0–100.0)
PLATELETS: 223 10*3/uL (ref 150–400)
RBC: 3.51 MIL/uL — ABNORMAL LOW (ref 3.87–5.11)
RDW: 13.6 % (ref 11.5–15.5)
WBC: 13 10*3/uL — ABNORMAL HIGH (ref 4.0–10.5)

## 2015-02-10 LAB — GLUCOSE, CAPILLARY
GLUCOSE-CAPILLARY: 149 mg/dL — AB (ref 65–99)
GLUCOSE-CAPILLARY: 149 mg/dL — AB (ref 65–99)
Glucose-Capillary: 151 mg/dL — ABNORMAL HIGH (ref 65–99)

## 2015-02-10 LAB — MAGNESIUM: MAGNESIUM: 1.7 mg/dL (ref 1.7–2.4)

## 2015-02-10 LAB — PHOSPHORUS: Phosphorus: 2.6 mg/dL (ref 2.5–4.6)

## 2015-02-10 MED ORDER — ONDANSETRON HCL 4 MG/2ML IJ SOLN
4.0000 mg | Freq: Four times a day (QID) | INTRAMUSCULAR | Status: DC | PRN
  Administered 2015-02-10 – 2015-02-11 (×4): 4 mg via INTRAVENOUS
  Filled 2015-02-10 (×3): qty 2

## 2015-02-10 MED ORDER — FAT EMULSION 20 % IV EMUL
250.0000 mL | INTRAVENOUS | Status: AC
  Administered 2015-02-10: 250 mL via INTRAVENOUS
  Filled 2015-02-10: qty 250

## 2015-02-10 MED ORDER — MENTHOL 3 MG MT LOZG
1.0000 | LOZENGE | OROMUCOSAL | Status: DC | PRN
  Administered 2015-02-11: 3 mg via ORAL
  Filled 2015-02-10: qty 9

## 2015-02-10 MED ORDER — M.V.I. ADULT IV INJ
INJECTION | INTRAVENOUS | Status: AC
  Administered 2015-02-10: 18:00:00 via INTRAVENOUS
  Filled 2015-02-10: qty 1680

## 2015-02-10 NOTE — Progress Notes (Signed)
1 Day Post-Op  Subjective: Moderate incisional pain with deep breaths. Is more bothered by the NG tube.  Objective: Vital signs in last 24 hours: Temp:  [97.4 F (36.3 C)-98 F (36.7 C)] 98 F (36.7 C) (09/24 0327) Pulse Rate:  [59-82] 76 (09/24 0327) Resp:  [15-24] 16 (09/24 0327) BP: (100-137)/(58-87) 100/63 mmHg (09/24 0327) SpO2:  [93 %-100 %] 96 % (09/24 0327) Last BM Date: 02/06/15  Intake/Output from previous day: 09/23 0701 - 09/24 0700 In: 1910 [I.V.:1910] Out: 2620 [Urine:1290; Emesis/NG output:200; Drains:280; Blood:150] Intake/Output this shift: Total I/O In: -  Out: 100 [Drains:100]  General appearance: alert, cooperative and no distress Resp: clear to auscultation bilaterally Cardio: regular rate and rhythm, S1, S2 normal, no murmur, click, rub or gallop GI: Soft. Dressing dry and intact. JP drainage serosanguineous in nature.  Lab Results:   Recent Labs  02/09/15 0633 02/10/15 0601  WBC 5.0 13.0*  HGB 11.7* 11.7*  HCT 34.1* 33.8*  PLT 207 223   BMET  Recent Labs  02/09/15 0633 02/10/15 0558  NA 139 132*  K 3.2* 3.5  CL 108 105  CO2 25 24  GLUCOSE 96 151*  BUN 10 13  CREATININE 0.79 0.64  CALCIUM 8.6* 7.6*   PT/INR No results for input(s): LABPROT, INR in the last 72 hours.  Studies/Results: Chest 2 View  02/08/2015   CLINICAL DATA:  Preop for gastric outlet obstruction surgery  EXAM: CHEST  2 VIEW  COMPARISON:  None.  FINDINGS: The cardiac silhouette, mediastinal and hilar contours are within normal limits. The lungs are clear. Possible tiny left effusion. No infiltrates or edema. The bony thorax is intact.  IMPRESSION: Possible tiny left pleural effusion but no infiltrates or edema.   Electronically Signed   By: Marijo Sanes M.D.   On: 02/08/2015 19:14    Anti-infectives: Anti-infectives    Start     Dose/Rate Route Frequency Ordered Stop   02/09/15 0600  ciprofloxacin (CIPRO) IVPB 400 mg     400 mg 200 mL/hr over 60 Minutes  Intravenous On call to O.R. 02/08/15 1715 02/09/15 1145      Assessment/Plan: s/p Procedure(s): DISTAL GASTRECTOMY WITH GASTROJEJUNOSTOMY; RIGHT HEMICOLECTOMY Impression: Stable on postoperative day 1. Urine output greatly improved after flushing the Foley catheter. Hemoglobin stable. Final pathology pending.  Plan: Continue TPN. Will need to be on NG tube decompression for 3 days.  LOS: 4 days    Heidi Cooper A 02/10/2015

## 2015-02-10 NOTE — Anesthesia Postprocedure Evaluation (Signed)
  Anesthesia Post-op Note  Patient: Heidi Cooper  Procedure(s) Performed: Procedure(s): DISTAL GASTRECTOMY WITH GASTROJEJUNOSTOMY; RIGHT HEMICOLECTOMY (N/A)  Patient Location: room 324  Anesthesia Type:General  Level of Consciousness: awake, alert , oriented and patient cooperative  Airway and Oxygen Therapy: Patient Spontanous Breathing and Patient connected to nasal cannula oxygen  Post-op Pain: 2 /10, mild  Post-op Assessment: Post-op Vital signs reviewed, Patient's Cardiovascular Status Stable, PATIENT'S CARDIOVASCULAR STATUS UNSTABLE, Patent Airway, No signs of Nausea or vomiting and Pain level controlled              Post-op Vital Signs: Reviewed and stable  Last Vitals:  Filed Vitals:   02/10/15 1500  BP: 121/58  Pulse: 71  Temp: 36.6 C  Resp: 16    Complications: No apparent anesthesia complications

## 2015-02-10 NOTE — Addendum Note (Signed)
Addendum  created 02/10/15 1550 by Charmaine Downs, CRNA   Modules edited: Notes Section   Notes Section:  File: [id number]

## 2015-02-10 NOTE — Progress Notes (Signed)
PARENTERAL NUTRITION CONSULT NOTE  Pharmacy Consult for TPN Indication: Gastric Outlet Obstruction / S/P surgery  Allergies  Allergen Reactions  . Other Itching    Mycins cause itching   . Sulfa Antibiotics Other (See Comments)    Childhood reaction   . Penicillins Rash    Childhood reaction    Patient Measurements: Height: 5\' 6"  (167.6 cm) Weight: 103 lb 4.8 oz (46.857 kg) IBW/kg (Calculated) : 59.3 Usual Weight: 65kg  Vital Signs: Temp: 98 F (36.7 C) (09/24 0327) Temp Source: Oral (09/24 0327) BP: 100/63 mmHg (09/24 0327) Pulse Rate: 76 (09/24 0327) Intake/Output from previous day: 09/23 0701 - 09/24 0700 In: 1910 [I.V.:1910] Out: 2620 [Urine:1290; Emesis/NG output:200; Drains:280; Blood:150] Intake/Output from this shift:   Labs:  Recent Labs  02/09/15 0633 02/10/15 0601  WBC 5.0 13.0*  HGB 11.7* 11.7*  HCT 34.1* 33.8*  PLT 207 223    Recent Labs  02/08/15 0552 02/08/15 0557 02/09/15 0633 02/10/15 0558  NA 140  --  139 132*  K 3.2*  --  3.2* 3.5  CL 110  --  108 105  CO2 26  --  25 24  GLUCOSE 90  --  96 151*  BUN 7  --  10 13  CREATININE 0.73  --  0.79 0.64  CALCIUM 8.2*  --  8.6* 7.6*  MG 2.1  --   --  1.7  PHOS 3.4  --   --  2.6  PROT 4.9*  --   --   --   ALBUMIN 3.0*  --   --   --   AST 20  --   --   --   ALT 13*  --   --   --   ALKPHOS 37*  --   --   --   BILITOT 0.3  --   --   --   PREALBUMIN  --  15.1*  --   --   TRIG 78  --   --   --    Estimated Creatinine Clearance: 56.1 mL/min (by C-G formula based on Cr of 0.64).   Recent Labs  02/09/15 1729 02/09/15 2330 02/10/15 0541  GLUCAP 144* 151* 149*   Medical History: Past Medical History  Diagnosis Date  . Migraine    Medications:  Prescriptions prior to admission  Medication Sig Dispense Refill Last Dose  . fluticasone (FLONASE) 50 MCG/ACT nasal spray Place 2 sprays into the nose daily as needed for allergies.    Past Week at Unknown time  . metoCLOPramide (REGLAN) 5  MG/5ML solution Take 10 mLs (10 mg total) by mouth 3 (three) times daily before meals. 120 mL 0 02/05/2015 at 0800  . ondansetron (ZOFRAN ODT) 4 MG disintegrating tablet Take 1 tablet (4 mg total) by mouth every 8 (eight) hours as needed for nausea. 30 tablet 0 02/05/2015 at 1200  . pantoprazole (PROTONIX) 40 MG tablet Take 40 mg by mouth at bedtime.    02/05/2015 at 1200  . polyethylene glycol-electrolytes (NULYTELY/GOLYTELY) 420 G solution Take 4,000 mLs by mouth once. 4000 mL 0 02/05/2015 at 1800  . promethazine (PHENERGAN) 12.5 MG tablet Take 1 tablet (12.5 mg total) by mouth every 6 (six) hours as needed for nausea or vomiting. 20 tablet 0 02/05/2015 at 0800  . ranitidine (ZANTAC) 300 MG tablet Take 300 mg by mouth 2 (two) times daily.    02/05/2015 at Unknown time  . topiramate (TOPAMAX) 25 MG tablet Take 25 mg by mouth at  bedtime.    02/05/2015 at 0800   Scheduled:  . enoxaparin (LOVENOX) injection  40 mg Subcutaneous Q24H  . fluticasone  2 spray Each Nare Daily  . insulin aspart  0-9 Units Subcutaneous 4 times per day  . pantoprazole (PROTONIX) IV  40 mg Intravenous BID AC  . sodium chloride  10-40 mL Intracatheter Q12H   Insulin Requirements in the past 24 hours:  4 units  Current Nutrition:  Unable to tolerate PO due to Gastric outlet obstruction over several months. NPO  Assessment: Inadequate oral intake related to inability to eat associated with GOO as evidenced by weight loss over 5 months.  Dietician note reviewed.  Tolerating TPN, sugars slightly elevated but not worrisome.  I/O (-) 710, Good UOP Calcium corrects to WNL when account for low albumin.  TGs OK. K+ OK, Na slightly low today.  Estimated Nutritional Needs:  Kcal: 1650-1850 kcals (35-40 kcal/kg) Protein: 71-89 g (1.2-1.5 g/kg bw) Fluid: 1.7-1.9 liters  Plan: Continue Clinimix E 5/15 at 70 ml/hr (goal rate)  Add MVI and Trace Elements to TPN Lipids at 10 cc/hr with TPN Replenish K+ w/ KCL runs when  needed Monitor electrolytes and CBGs, fluid and glucose tolerance.  Thanks for allowing [place] to participate in the care of this patient,  Hart Robinsons, PharmD  Clinical Pharmacist  02/10/2015,8:24 AM

## 2015-02-10 NOTE — Progress Notes (Signed)
Patient complaining of feeling like she needs to urinate.  Currently has a foley cath.  Checked the cath.  400 cc in the bag and urine noted in tubing.  Noticed the securing device was around the flexible part of the cath tubing.  Took off the securing device an immediately got 400 cc out.  Patient felling relief.  Replace the securing device around the hard plastic part of the cath as not to obstruct urine flow.  Urine continuing to freely flow.  Patient now resting comfortably.  Will continue to monitor.

## 2015-02-11 LAB — GLUCOSE, CAPILLARY
GLUCOSE-CAPILLARY: 123 mg/dL — AB (ref 65–99)
GLUCOSE-CAPILLARY: 136 mg/dL — AB (ref 65–99)
Glucose-Capillary: 117 mg/dL — ABNORMAL HIGH (ref 65–99)
Glucose-Capillary: 126 mg/dL — ABNORMAL HIGH (ref 65–99)
Glucose-Capillary: 116 mg/dL — ABNORMAL HIGH (ref 65–99)
Glucose-Capillary: 98 mg/dL (ref 65–99)

## 2015-02-11 LAB — CBC
HEMATOCRIT: 31 % — AB (ref 36.0–46.0)
Hemoglobin: 10.8 g/dL — ABNORMAL LOW (ref 12.0–15.0)
MCH: 33.6 pg (ref 26.0–34.0)
MCHC: 34.8 g/dL (ref 30.0–36.0)
MCV: 96.6 fL (ref 78.0–100.0)
Platelets: 211 10*3/uL (ref 150–400)
RBC: 3.21 MIL/uL — ABNORMAL LOW (ref 3.87–5.11)
RDW: 13.8 % (ref 11.5–15.5)
WBC: 12.6 10*3/uL — AB (ref 4.0–10.5)

## 2015-02-11 LAB — BASIC METABOLIC PANEL
Anion gap: 4 — ABNORMAL LOW (ref 5–15)
BUN: 15 mg/dL (ref 6–20)
CALCIUM: 7.8 mg/dL — AB (ref 8.9–10.3)
CO2: 27 mmol/L (ref 22–32)
CREATININE: 0.57 mg/dL (ref 0.44–1.00)
Chloride: 103 mmol/L (ref 101–111)
GFR calc Af Amer: >60 mL/min (ref >60)
GLUCOSE: 107 mg/dL — AB (ref 65–99)
POTASSIUM: 3.4 mmol/L — AB (ref 3.5–5.1)
SODIUM: 134 mmol/L — AB (ref 135–145)

## 2015-02-11 LAB — MAGNESIUM: MAGNESIUM: 1.9 mg/dL (ref 1.7–2.4)

## 2015-02-11 LAB — PHOSPHORUS: PHOSPHORUS: 2.8 mg/dL (ref 2.5–4.6)

## 2015-02-11 MED ORDER — FAT EMULSION 20 % IV EMUL
250.0000 mL | INTRAVENOUS | Status: AC
  Administered 2015-02-11: 250 mL via INTRAVENOUS
  Filled 2015-02-11: qty 250

## 2015-02-11 MED ORDER — INSULIN ASPART 100 UNIT/ML ~~LOC~~ SOLN
0.0000 [IU] | Freq: Three times a day (TID) | SUBCUTANEOUS | Status: DC
  Administered 2015-02-11 – 2015-02-12 (×2): 1 [IU] via SUBCUTANEOUS

## 2015-02-11 MED ORDER — ONDANSETRON HCL 4 MG/2ML IJ SOLN
4.0000 mg | INTRAMUSCULAR | Status: DC | PRN
Start: 2015-02-11 — End: 2015-02-19
  Administered 2015-02-11 – 2015-02-17 (×26): 4 mg via INTRAVENOUS
  Filled 2015-02-11 (×27): qty 2

## 2015-02-11 MED ORDER — TRACE MINERALS CR-CU-MN-SE-ZN 10-1000-500-60 MCG/ML IV SOLN
INTRAVENOUS | Status: AC
  Administered 2015-02-11: 17:00:00 via INTRAVENOUS
  Filled 2015-02-11: qty 1680

## 2015-02-11 NOTE — Progress Notes (Signed)
2 Days Post-Op  Subjective: Feels better today. Less abdominal pain noted.  Objective: Vital signs in last 24 hours: Temp:  [97.8 F (36.6 C)-98 F (36.7 C)] 98 F (36.7 C) (09/25 2122) Pulse Rate:  [71-87] 78 (09/25 0611) Resp:  [15-16] 16 (09/25 0611) BP: (111-121)/(54-64) 114/64 mmHg (09/25 0611) SpO2:  [95 %-100 %] 100 % (09/25 0611) Last BM Date: 02/06/15  Intake/Output from previous day: 09/24 0701 - 09/25 0700 In: 8894.8 [I.V.:1606.9; QMG:5003.7] Out: 0488 [Urine:2425; Emesis/NG output:90; Drains:540] Intake/Output this shift: Total I/O In: 10 [I.V.:10] Out: 15 [Drains:15]  General appearance: alert, cooperative and no distress Resp: clear to auscultation bilaterally Cardio: regular rate and rhythm, S1, S2 normal, no murmur, click, rub or gallop GI: Soft, incision healing well. JP drainage decreasing and serosanguineous in nature. No bile noted.  Lab Results:   Recent Labs  02/10/15 0601 02/11/15 0623  WBC 13.0* 12.6*  HGB 11.7* 10.8*  HCT 33.8* 31.0*  PLT 223 211   BMET  Recent Labs  02/10/15 0558 02/11/15 0623  NA 132* 134*  K 3.5 3.4*  CL 105 103  CO2 24 27  GLUCOSE 151* 107*  BUN 13 15  CREATININE 0.64 0.57  CALCIUM 7.6* 7.8*   PT/INR No results for input(s): LABPROT, INR in the last 72 hours.  Studies/Results: No results found.  Anti-infectives: Anti-infectives    Start     Dose/Rate Route Frequency Ordered Stop   02/09/15 0600  ciprofloxacin (CIPRO) IVPB 400 mg     400 mg 200 mL/hr over 60 Minutes Intravenous On call to O.R. 02/08/15 1715 02/09/15 1145      Assessment/Plan: s/p Procedure(s): DISTAL GASTRECTOMY WITH GASTROJEJUNOSTOMY; RIGHT HEMICOLECTOMY Impression: Stable on postoperative day 2. Labs look good. Continuing TPN. Sitting up in chair. Continue current management.  LOS: 5 days    JENKINS,MARK A 02/11/2015

## 2015-02-11 NOTE — Progress Notes (Signed)
PARENTERAL NUTRITION CONSULT NOTE  Pharmacy Consult for TPN Indication: Gastric Outlet Obstruction / S/P surgery  Allergies  Allergen Reactions  . Other Itching    Mycins cause itching   . Sulfa Antibiotics Other (See Comments)    Childhood reaction   . Penicillins Rash    Childhood reaction    Patient Measurements: Height: 5\' 6"  (167.6 cm) Weight: 103 lb 4.8 oz (46.857 kg) IBW/kg (Calculated) : 59.3 Usual Weight: 65kg  Vital Signs: Temp: 98 F (36.7 C) (09/25 0611) Temp Source: Oral (09/25 4580) BP: 114/64 mmHg (09/25 9983) Pulse Rate: 78 (09/25 0611) Intake/Output from previous day: 09/24 0701 - 09/25 0700 In: 8894.8 [I.V.:1606.9; JAS:5053.9] Out: 3055 [Urine:2425; Emesis/NG output:90; Drains:540] Intake/Output from this shift:   Labs:  Recent Labs  02/09/15 0633 02/10/15 0601 02/11/15 0623  WBC 5.0 13.0* 12.6*  HGB 11.7* 11.7* 10.8*  HCT 34.1* 33.8* 31.0*  PLT 207 223 211    Recent Labs  02/09/15 0633 02/10/15 0558 02/11/15 0623  NA 139 132* 134*  K 3.2* 3.5 3.4*  CL 108 105 103  CO2 25 24 27   GLUCOSE 96 151* 107*  BUN 10 13 15   CREATININE 0.79 0.64 0.57  CALCIUM 8.6* 7.6* 7.8*  MG  --  1.7 1.9  PHOS  --  2.6 2.8   Estimated Creatinine Clearance: 56.1 mL/min (by C-G formula based on Cr of 0.57).   Recent Labs  02/10/15 1734 02/10/15 2354 02/11/15 0608  GLUCAP 151* 123* 117*   Medical History: Past Medical History  Diagnosis Date  . Migraine    Medications:  Prescriptions prior to admission  Medication Sig Dispense Refill Last Dose  . fluticasone (FLONASE) 50 MCG/ACT nasal spray Place 2 sprays into the nose daily as needed for allergies.    Past Week at Unknown time  . metoCLOPramide (REGLAN) 5 MG/5ML solution Take 10 mLs (10 mg total) by mouth 3 (three) times daily before meals. 120 mL 0 02/05/2015 at 0800  . ondansetron (ZOFRAN ODT) 4 MG disintegrating tablet Take 1 tablet (4 mg total) by mouth every 8 (eight) hours as needed for  nausea. 30 tablet 0 02/05/2015 at 1200  . pantoprazole (PROTONIX) 40 MG tablet Take 40 mg by mouth at bedtime.    02/05/2015 at 1200  . polyethylene glycol-electrolytes (NULYTELY/GOLYTELY) 420 G solution Take 4,000 mLs by mouth once. 4000 mL 0 02/05/2015 at 1800  . promethazine (PHENERGAN) 12.5 MG tablet Take 1 tablet (12.5 mg total) by mouth every 6 (six) hours as needed for nausea or vomiting. 20 tablet 0 02/05/2015 at 0800  . ranitidine (ZANTAC) 300 MG tablet Take 300 mg by mouth 2 (two) times daily.    02/05/2015 at Unknown time  . topiramate (TOPAMAX) 25 MG tablet Take 25 mg by mouth at bedtime.    02/05/2015 at 0800   Scheduled:  . enoxaparin (LOVENOX) injection  40 mg Subcutaneous Q24H  . fluticasone  2 spray Each Nare Daily  . insulin aspart  0-9 Units Subcutaneous Q8H  . pantoprazole (PROTONIX) IV  40 mg Intravenous BID AC  . sodium chloride  10-40 mL Intracatheter Q12H   Insulin Requirements in the past 24 hours:  4 units  Current Nutrition:  Unable to tolerate PO due to Gastric outlet obstruction over several months. NPO  Assessment: Inadequate oral intake related to inability to eat associated with GOO as evidenced by weight loss over 5 months.  Dietician note reviewed.  Tolerating TPN, serum glucose / CBG's OK.  I/O  is NOT ACCURATE (TPN intake is miscalculated).  Good UOP Calcium corrects to WNL when account for low albumin.  TGs OK. K+ OK, Na slightly low today.  Estimated Nutritional Needs:  Kcal: 1650-1850 kcals (35-40 kcal/kg) Protein: 71-89 g (1.2-1.5 g/kg bw) Fluid: 1.7-1.9 liters  Plan: Continue Clinimix E 5/15 at 70 ml/hr (goal rate)  Add MVI and Trace Elements to TPN Lipids at 10 cc/hr with TPN Replenish K+ w/ KCL runs when needed Monitor electrolytes, LFT's, and CBGs, fluid and glucose tolerance.  Thanks for allowing [place] to participate in the care of this patient,  Hart Robinsons, PharmD  Clinical Pharmacist  02/11/2015,8:45 AM

## 2015-02-11 NOTE — Progress Notes (Signed)
Foley cath removed per order.  Patient tolerated well.  Will continue to monitor.

## 2015-02-12 ENCOUNTER — Encounter (HOSPITAL_COMMUNITY): Payer: Self-pay | Admitting: General Surgery

## 2015-02-12 ENCOUNTER — Inpatient Hospital Stay (HOSPITAL_COMMUNITY): Payer: PRIVATE HEALTH INSURANCE

## 2015-02-12 LAB — CBC
HCT: 27.5 % — ABNORMAL LOW (ref 36.0–46.0)
Hemoglobin: 9.5 g/dL — ABNORMAL LOW (ref 12.0–15.0)
MCH: 33.6 pg (ref 26.0–34.0)
MCHC: 34.5 g/dL (ref 30.0–36.0)
MCV: 97.2 fL (ref 78.0–100.0)
PLATELETS: 190 10*3/uL (ref 150–400)
RBC: 2.83 MIL/uL — ABNORMAL LOW (ref 3.87–5.11)
RDW: 13.8 % (ref 11.5–15.5)
WBC: 7.3 10*3/uL (ref 4.0–10.5)

## 2015-02-12 LAB — COMPREHENSIVE METABOLIC PANEL
ALK PHOS: 37 U/L — AB (ref 38–126)
ALT: 17 U/L (ref 14–54)
AST: 17 U/L (ref 15–41)
Albumin: 2.2 g/dL — ABNORMAL LOW (ref 3.5–5.0)
Anion gap: 3 — ABNORMAL LOW (ref 5–15)
BILIRUBIN TOTAL: 0.3 mg/dL (ref 0.3–1.2)
BUN: 14 mg/dL (ref 6–20)
CALCIUM: 7.8 mg/dL — AB (ref 8.9–10.3)
CO2: 26 mmol/L (ref 22–32)
CREATININE: 0.51 mg/dL (ref 0.44–1.00)
Chloride: 107 mmol/L (ref 101–111)
Glucose, Bld: 120 mg/dL — ABNORMAL HIGH (ref 65–99)
Potassium: 3.5 mmol/L (ref 3.5–5.1)
Sodium: 136 mmol/L (ref 135–145)
Total Protein: 4.5 g/dL — ABNORMAL LOW (ref 6.5–8.1)

## 2015-02-12 LAB — DIFFERENTIAL
BASOS PCT: 0 %
Basophils Absolute: 0 10*3/uL (ref 0.0–0.1)
EOS PCT: 2 %
Eosinophils Absolute: 0.2 10*3/uL (ref 0.0–0.7)
Lymphocytes Relative: 14 %
Lymphs Abs: 1 10*3/uL (ref 0.7–4.0)
MONO ABS: 0.6 10*3/uL (ref 0.1–1.0)
MONOS PCT: 8 %
Neutro Abs: 5.6 10*3/uL (ref 1.7–7.7)
Neutrophils Relative %: 76 %

## 2015-02-12 LAB — MAGNESIUM: MAGNESIUM: 2 mg/dL (ref 1.7–2.4)

## 2015-02-12 LAB — GLUCOSE, CAPILLARY
GLUCOSE-CAPILLARY: 100 mg/dL — AB (ref 65–99)
GLUCOSE-CAPILLARY: 111 mg/dL — AB (ref 65–99)
Glucose-Capillary: 103 mg/dL — ABNORMAL HIGH (ref 65–99)
Glucose-Capillary: 126 mg/dL — ABNORMAL HIGH (ref 65–99)

## 2015-02-12 LAB — PREALBUMIN: Prealbumin: 9.9 mg/dL — ABNORMAL LOW (ref 18–38)

## 2015-02-12 LAB — PHOSPHORUS: PHOSPHORUS: 3.1 mg/dL (ref 2.5–4.6)

## 2015-02-12 LAB — TRIGLYCERIDES: TRIGLYCERIDES: 70 mg/dL (ref <150)

## 2015-02-12 MED ORDER — POTASSIUM CHLORIDE 10 MEQ/100ML IV SOLN
10.0000 meq | INTRAVENOUS | Status: AC
  Administered 2015-02-12 (×2): 10 meq via INTRAVENOUS
  Filled 2015-02-12: qty 100

## 2015-02-12 MED ORDER — TRACE MINERALS CR-CU-MN-SE-ZN 10-1000-500-60 MCG/ML IV SOLN
INTRAVENOUS | Status: DC
  Administered 2015-02-12: 22:00:00 via INTRAVENOUS
  Filled 2015-02-12: qty 1680

## 2015-02-12 MED ORDER — IOHEXOL 300 MG/ML  SOLN
150.0000 mL | Freq: Once | INTRAMUSCULAR | Status: DC | PRN
  Administered 2015-02-12: 150 mL via ORAL
  Filled 2015-02-12: qty 150

## 2015-02-12 MED ORDER — POTASSIUM CHLORIDE 10 MEQ/50ML IV SOLN
10.0000 meq | INTRAVENOUS | Status: DC
  Filled 2015-02-12 (×2): qty 50

## 2015-02-12 MED ORDER — FAT EMULSION 20 % IV EMUL
250.0000 mL | INTRAVENOUS | Status: DC
  Administered 2015-02-12: 250 mL via INTRAVENOUS
  Filled 2015-02-12: qty 250

## 2015-02-12 NOTE — Progress Notes (Signed)
3 Days Post-Op  Subjective: Pain is more tolerable. No other complaints noted.  Objective: Vital signs in last 24 hours: Temp:  [97.5 F (36.4 C)-97.9 F (36.6 C)] 97.9 F (36.6 C) (09/26 0648) Pulse Rate:  [71-77] 74 (09/26 0648) Resp:  [18-20] 20 (09/26 0648) BP: (98-124)/(54-61) 98/61 mmHg (09/26 0648) SpO2:  [100 %] 100 % (09/26 0648) Last BM Date: 02/06/15  Intake/Output from previous day: 09/25 0701 - 09/26 0700 In: 10 [I.V.:10] Out: 885 [Urine:700; Emesis/NG output:60; Drains:125] Intake/Output this shift:    General appearance: alert, cooperative and no distress Resp: clear to auscultation bilaterally Cardio: regular rate and rhythm, S1, S2 normal, no murmur, click, rub or gallop GI: Soft, incision healing well. JP drainage decreased and serosanguineous in nature.  Lab Results:   Recent Labs  02/11/15 0623 02/12/15 0633  WBC 12.6* 7.3  HGB 10.8* 9.5*  HCT 31.0* 27.5*  PLT 211 190   BMET  Recent Labs  02/11/15 0623 02/12/15 0633  NA 134* 136  K 3.4* 3.5  CL 103 107  CO2 27 26  GLUCOSE 107* 120*  BUN 15 14  CREATININE 0.57 0.51  CALCIUM 7.8* 7.8*   PT/INR No results for input(s): LABPROT, INR in the last 72 hours.  Studies/Results: No results found.  Anti-infectives: Anti-infectives    Start     Dose/Rate Route Frequency Ordered Stop   02/09/15 0600  ciprofloxacin (CIPRO) IVPB 400 mg     400 mg 200 mL/hr over 60 Minutes Intravenous On call to O.R. 02/08/15 1715 02/09/15 1145      Assessment/Plan: s/p Procedure(s): DISTAL GASTRECTOMY WITH GASTROJEJUNOSTOMY; RIGHT HEMICOLECTOMY Impression: Stable on postoperative day 3. Will get upper GI series to assess anastomosis. Should there be no leak, we'll remove NG tube.  LOS: 6 days    JENKINS,MARK A 02/12/2015

## 2015-02-12 NOTE — Progress Notes (Signed)
PARENTERAL NUTRITION CONSULT NOTE  Pharmacy Consult for TPN Indication: Gastric Outlet Obstruction / S/P surgery  Allergies  Allergen Reactions  . Other Itching    Mycins cause itching   . Sulfa Antibiotics Other (See Comments)    Childhood reaction   . Penicillins Rash    Childhood reaction    Patient Measurements: Height: 5\' 6"  (167.6 cm) Weight: 103 lb 4.8 oz (46.857 kg) IBW/kg (Calculated) : 59.3 Usual Weight: 65kg  Vital Signs: Temp: 97.9 F (36.6 C) (09/26 0648) Temp Source: Oral (09/26 0648) BP: 98/61 mmHg (09/26 2353) Pulse Rate: 74 (09/26 0648) Intake/Output from previous day: 09/25 0701 - 09/26 0700 In: 10 [I.V.:10] Out: 885 [Urine:700; Emesis/NG output:60; Drains:125] Intake/Output from this shift:   Labs:  Recent Labs  02/10/15 0601 02/11/15 0623 02/12/15 0633  WBC 13.0* 12.6* 7.3  HGB 11.7* 10.8* 9.5*  HCT 33.8* 31.0* 27.5*  PLT 223 211 190    Recent Labs  02/10/15 0558 02/11/15 0623 02/12/15 0633  NA 132* 134* 136  K 3.5 3.4* 3.5  CL 105 103 107  CO2 24 27 26   GLUCOSE 151* 107* 120*  BUN 13 15 14   CREATININE 0.64 0.57 0.51  CALCIUM 7.6* 7.8* 7.8*  MG 1.7 1.9 2.0  PHOS 2.6 2.8 3.1  PROT  --   --  4.5*  ALBUMIN  --   --  2.2*  AST  --   --  17  ALT  --   --  17  ALKPHOS  --   --  37*  BILITOT  --   --  0.3  TRIG  --   --  70   Estimated Creatinine Clearance: 56.1 mL/min (by C-G formula based on Cr of 0.51).   Recent Labs  02/11/15 1653 02/11/15 2235 02/12/15 0603  GLUCAP 116* 98 100*   Medical History: Past Medical History  Diagnosis Date  . Migraine    Medications:  Prescriptions prior to admission  Medication Sig Dispense Refill Last Dose  . fluticasone (FLONASE) 50 MCG/ACT nasal spray Place 2 sprays into the nose daily as needed for allergies.    Past Week at Unknown time  . metoCLOPramide (REGLAN) 5 MG/5ML solution Take 10 mLs (10 mg total) by mouth 3 (three) times daily before meals. 120 mL 0 02/05/2015 at 0800  .  ondansetron (ZOFRAN ODT) 4 MG disintegrating tablet Take 1 tablet (4 mg total) by mouth every 8 (eight) hours as needed for nausea. 30 tablet 0 02/05/2015 at 1200  . pantoprazole (PROTONIX) 40 MG tablet Take 40 mg by mouth at bedtime.    02/05/2015 at 1200  . polyethylene glycol-electrolytes (NULYTELY/GOLYTELY) 420 G solution Take 4,000 mLs by mouth once. 4000 mL 0 02/05/2015 at 1800  . promethazine (PHENERGAN) 12.5 MG tablet Take 1 tablet (12.5 mg total) by mouth every 6 (six) hours as needed for nausea or vomiting. 20 tablet 0 02/05/2015 at 0800  . ranitidine (ZANTAC) 300 MG tablet Take 300 mg by mouth 2 (two) times daily.    02/05/2015 at Unknown time  . topiramate (TOPAMAX) 25 MG tablet Take 25 mg by mouth at bedtime.    02/05/2015 at 0800   Scheduled:  . enoxaparin (LOVENOX) injection  40 mg Subcutaneous Q24H  . fluticasone  2 spray Each Nare Daily  . insulin aspart  0-9 Units Subcutaneous Q8H  . pantoprazole (PROTONIX) IV  40 mg Intravenous BID AC  . sodium chloride  10-40 mL Intracatheter Q12H   Insulin Requirements in the  past 24 hours:  4 units  Current Nutrition:  Unable to tolerate PO due to Gastric outlet obstruction over several months. NPO  Assessment: Inadequate oral intake related to inability to eat associated with GOO as evidenced by weight loss over 5 months.  Dietician note reviewed.  Tolerating TPN, serum glucose / CBG's OK.  I/O is NOT ACCURATE (TPN intake is miscalculated).  Good UOP Calcium corrects to WNL when account for low albumin.  TGs OK. K+=3.5.  Estimated Nutritional Needs:  Kcal: 1650-1850 kcals (35-40 kcal/kg) Protein: 71-89 g (1.2-1.5 g/kg bw) Fluid: 1.7-1.9 liters  Plan: Continue Clinimix E 5/15 at 70 ml/hr (goal rate)  Added MVI and Trace Elements to TPN Lipids at 10 cc/hr with TPN Replenish K+ w/ KCL runs when needed Monitor electrolytes, LFT's, and CBGs, fluid and glucose tolerance.  Thanks for allowing [place] to participate in the care of this  patient, Heidi Cooper, BS [place], BCPS Clinical Pharmacist Pager [phone]  02/12/2015,9:41 AM

## 2015-02-13 LAB — CBC
HCT: 27.9 % — ABNORMAL LOW (ref 36.0–46.0)
Hemoglobin: 9.4 g/dL — ABNORMAL LOW (ref 12.0–15.0)
MCH: 33.3 pg (ref 26.0–34.0)
MCHC: 33.7 g/dL (ref 30.0–36.0)
MCV: 98.9 fL (ref 78.0–100.0)
PLATELETS: 200 10*3/uL (ref 150–400)
RBC: 2.82 MIL/uL — AB (ref 3.87–5.11)
RDW: 13.9 % (ref 11.5–15.5)
WBC: 5.6 10*3/uL (ref 4.0–10.5)

## 2015-02-13 LAB — GLUCOSE, CAPILLARY
GLUCOSE-CAPILLARY: 108 mg/dL — AB (ref 65–99)
Glucose-Capillary: 109 mg/dL — ABNORMAL HIGH (ref 65–99)

## 2015-02-13 MED ORDER — HYDROCODONE-ACETAMINOPHEN 5-325 MG PO TABS
1.0000 | ORAL_TABLET | ORAL | Status: DC | PRN
  Administered 2015-02-15 – 2015-02-17 (×5): 2 via ORAL
  Filled 2015-02-13 (×5): qty 2

## 2015-02-13 MED ORDER — M.V.I. ADULT IV INJ
INJECTION | INTRAVENOUS | Status: AC
  Filled 2015-02-13: qty 1680

## 2015-02-13 MED ORDER — TOPIRAMATE 25 MG PO TABS
25.0000 mg | ORAL_TABLET | Freq: Every day | ORAL | Status: DC
  Administered 2015-02-13 – 2015-02-18 (×6): 25 mg via ORAL
  Filled 2015-02-13 (×10): qty 1

## 2015-02-13 MED ORDER — PANTOPRAZOLE SODIUM 40 MG PO TBEC
40.0000 mg | DELAYED_RELEASE_TABLET | Freq: Every day | ORAL | Status: DC
  Administered 2015-02-13 – 2015-02-18 (×6): 40 mg via ORAL
  Filled 2015-02-13 (×6): qty 1

## 2015-02-13 MED ORDER — BOOST / RESOURCE BREEZE PO LIQD
1.0000 | Freq: Two times a day (BID) | ORAL | Status: DC
  Administered 2015-02-13 – 2015-02-14 (×3): 1 via ORAL

## 2015-02-13 NOTE — Progress Notes (Addendum)
Follow Up Nutrition Assessment  DOCUMENTATION CODES:  Underweight, Severe malnutrition in context of chronic illness  INTERVENTION:  TPN per pharmacy   Boost Breeze po BID, each supplement provides 250 kcal and 9 grams of protein  Magic cup daily, each supplement provides 290 kcal and 9 grams of protein  NUTRITION DIAGNOSIS:  Inadequate oral intake   progressing  GOAL:  Patient will meet greater than or equal to 90% of their needs  MONITOR:  Oral intake, Diet advancement, Weight trends, I & O's, Labs, education need  ASSESSMENT:  59 y/o female w/ no significant PMHx presents with reported several month history of nausea vomiting, abdominal pain, 35 pound weight loss. Yesterday had EGD found to have gastric outlet obstruction. 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 Now Post op Day 4. Had NG tube for suction which is now removed. TPN beginning to be weaned. Today advanced to full liquid diet  Pt reports that she has been able to tolerate her full liquid diet thus far. She has had no pain, n/v. She has not had bowel movement. Reccommended adding a supplement to increase her protein intake. She does not like the thicker supplements. Will add Resource Breeze BID and Magic Cup to increase calories/protein.   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 Pt did not seem to really be interested in talking so I did not go into the importance of fluid/protein and the appropriate way to consume them at this time.  Will f/u as diet is advanced.   Diet Order:  .TPN (CLINIMIX-E) Adult Diet full liquid Room service appropriate?: Yes; Fluid consistency:: Thin .TPN (CLINIMIX-E) Adult  Skin:  Surgical Incision  abdomen  Last BM:  9/20-no BM since surgery.   Height:  Ht Readings from Last 1 Encounters:  02/06/15 5\' 6"  (1.676 m)   Weight:  Wt Readings from Last 1 Encounters:  02/06/15 103 lb 4.8 oz (46.857 kg)   Wt Readings from Last 10 Encounters:  02/06/15 103 lb 4.8 oz (46.857 kg)  01/23/15 111 lb (50.349 kg)  01/21/15 108 lb (48.988 kg)  01/15/15 108 lb 9.6 oz (49.261 kg)   Ideal Body Weight:  59.1 kg  BMI:  Body mass index is 16.68 kg/(m^2).  Estimated Nutritional Needs:  Kcal:  1650-1850 kcals (35-40 kcal/kg) Protein:  71-89 g (1.2-1.5 g/kg bw) Fluid:  1.7-1.9 liters  EDUCATION NEEDS:  No education needs identified at this time  Burtis Junes RD, LDN Nutrition Pager: [phone] 02/13/2015 2:58 PM

## 2015-02-13 NOTE — Progress Notes (Addendum)
PARENTERAL NUTRITION CONSULT NOTE  Pharmacy Consult for TPN Indication: Gastric Outlet Obstruction / S/P surgery  Allergies  Allergen Reactions  . Other Itching    Mycins cause itching   . Sulfa Antibiotics Other (See Comments)    Childhood reaction   . Penicillins Rash    Childhood reaction    Patient Measurements: Height: 5\' 6"  (167.6 cm) Weight: 103 lb 4.8 oz (46.857 kg) IBW/kg (Calculated) : 59.3 Usual Weight: 65kg  Vital Signs: Temp: 98.5 F (36.9 C) (09/26 2215) Temp Source: Oral (09/26 2215) BP: 110/62 mmHg (09/26 2215) Pulse Rate: 70 (09/26 2215) Intake/Output from previous day: 09/26 0701 - 09/27 0700 In: 240 [P.O.:240] Out: 520 [Drains:520] Intake/Output from this shift:   Labs:  Recent Labs  02/11/15 0623 02/12/15 0633 02/13/15 0618  WBC 12.6* 7.3 5.6  HGB 10.8* 9.5* 9.4*  HCT 31.0* 27.5* 27.9*  PLT 211 190 200    Recent Labs  02/11/15 0623 02/12/15 0633  NA 134* 136  K 3.4* 3.5  CL 103 107  CO2 27 26  GLUCOSE 107* 120*  BUN 15 14  CREATININE 0.57 0.51  CALCIUM 7.8* 7.8*  MG 1.9 2.0  PHOS 2.8 3.1  PROT  --  4.5*  ALBUMIN  --  2.2*  AST  --  17  ALT  --  17  ALKPHOS  --  37*  BILITOT  --  0.3  PREALBUMIN  --  9.9*  TRIG  --  70   Estimated Creatinine Clearance: 55.4 mL/min (by C-G formula based on Cr of 0.51).   Recent Labs  02/12/15 1419 02/12/15 2154 02/13/15 0602  GLUCAP 126* 111* 108*   Medical History: Past Medical History  Diagnosis Date  . Migraine    Medications:  Prescriptions prior to admission  Medication Sig Dispense Refill Last Dose  . fluticasone (FLONASE) 50 MCG/ACT nasal spray Place 2 sprays into the nose daily as needed for allergies.    Past Week at Unknown time  . metoCLOPramide (REGLAN) 5 MG/5ML solution Take 10 mLs (10 mg total) by mouth 3 (three) times daily before meals. 120 mL 0 02/05/2015 at 0800  . ondansetron (ZOFRAN ODT) 4 MG disintegrating tablet Take 1 tablet (4 mg total) by mouth every 8  (eight) hours as needed for nausea. 30 tablet 0 02/05/2015 at 1200  . pantoprazole (PROTONIX) 40 MG tablet Take 40 mg by mouth at bedtime.    02/05/2015 at 1200  . polyethylene glycol-electrolytes (NULYTELY/GOLYTELY) 420 G solution Take 4,000 mLs by mouth once. 4000 mL 0 02/05/2015 at 1800  . promethazine (PHENERGAN) 12.5 MG tablet Take 1 tablet (12.5 mg total) by mouth every 6 (six) hours as needed for nausea or vomiting. 20 tablet 0 02/05/2015 at 0800  . ranitidine (ZANTAC) 300 MG tablet Take 300 mg by mouth 2 (two) times daily.    02/05/2015 at Unknown time  . topiramate (TOPAMAX) 25 MG tablet Take 25 mg by mouth at bedtime.    02/05/2015 at 0800   Scheduled:  . enoxaparin (LOVENOX) injection  40 mg Subcutaneous Q24H  . fluticasone  2 spray Each Nare Daily  . insulin aspart  0-9 Units Subcutaneous Q8H  . pantoprazole (PROTONIX) IV  40 mg Intravenous BID AC  . sodium chloride  10-40 mL Intracatheter Q12H   Insulin Requirements in the past 24 hours:  4 units  Current Nutrition:  Unable to tolerate PO due to Gastric outlet obstruction over several months. NPO  Assessment: Inadequate oral intake related to  inability to eat associated with GOO as evidenced by weight loss over 5 months.  Dietician note reviewed.  Tolerating TPN, serum glucose / CBG's OK.  I/O is NOT ACCURATE (TPN intake is miscalculated).  Good UOP Calcium corrects to WNL when account for low albumin.  TGs OK. K+=3.5. Upper GI series showed no anastomotic leak. Diet will be advanced. To start weaning TPN.  Estimated Nutritional Needs:  Kcal: 1650-1850 kcals (35-40 kcal/kg) Protein: 71-89 g (1.2-1.5 g/kg bw) Fluid: 1.7-1.9 liters  Plan: Start weaning Clinimix E 5/15 Will decrease rate to 61mls/hr now  then D/C TPN at 1800   Thanks for allowing [place] to participate in the care of this patient, [name], BS [place], BCPS Clinical Pharmacist Pager [phone]  02/13/2015,7:52 AM

## 2015-02-13 NOTE — Progress Notes (Signed)
4 Days Post-Op  Subjective: Feeling much better with NG tube removal. She is hungry.  Objective: Vital signs in last 24 hours: Temp:  [98 F (36.7 C)-98.5 F (36.9 C)] 98.5 F (36.9 C) (09/26 2215) Pulse Rate:  [70-71] 70 (09/26 2215) Resp:  [20] 20 (09/26 2215) BP: (107-110)/(50-62) 110/62 mmHg (09/26 2215) SpO2:  [99 %-100 %] 100 % (09/26 2215) Last BM Date: 02/06/15  Intake/Output from previous day: 09/26 0701 - 09/27 0700 In: 240 [P.O.:240] Out: 520 [Drains:520] Intake/Output this shift: Total I/O In: 120 [P.O.:120] Out: -   General appearance: alert, cooperative and no distress Resp: clear to auscultation bilaterally Cardio: regular rate and rhythm, S1, S2 normal, no murmur, click, rub or gallop GI: Soft, incision healing well. JP drainage serosanguineous in nature.  Lab Results:   Recent Labs  02/12/15 0633 02/13/15 0618  WBC 7.3 5.6  HGB 9.5* 9.4*  HCT 27.5* 27.9*  PLT 190 200   BMET  Recent Labs  02/11/15 0623 02/12/15 0633  NA 134* 136  K 3.4* 3.5  CL 103 107  CO2 27 26  GLUCOSE 107* 120*  BUN 15 14  CREATININE 0.57 0.51  CALCIUM 7.8* 7.8*   PT/INR No results for input(s): LABPROT, INR in the last 72 hours.  Studies/Results: Dg Ugi W/water Sol Cm  02/12/2015   CLINICAL DATA:  Gastric cancer, post distal gastrectomy with gastrojejunostomy and Roux-en-Y anastomosis  EXAM: WATER SOLUBLE UPPER GI SERIES  TECHNIQUE: Single-column upper GI series was performed using water soluble contrast.  CONTRAST:  132mL OMNIPAQUE IOHEXOL 300 MG/ML  SOLN IV  COMPARISON:  CT abdomen and pelvis 01/21/2015  FLUOROSCOPY TIME:  Radiation Exposure Index (as provided by the fluoroscopic device): Not provided  If the device does not provide the exposure index:  Fluoroscopy Time (in minutes and seconds):  2 minutes 6 seconds  Number of Acquired Images: 1 plus multiple screen captures during fluoroscopy  FINDINGS: Nasogastric tube present with tip projecting over distal  stomach near gastrojejunostomy anastomosis.  Laparotomy skin clips.  Surgical drain traverses mid abdomen and LEFT upper quadrant.  Paucity of bowel gas on scout image.  Approximate 100 mL of Omnipaque 300 was instilled via nasogastric tube into gastric remnant.  Stomach distends normally without irregularity or filling defect.  No gastroesophageal reflux seen during exam.  Post distal gastrectomy with patent gastrojejunostomy anastomosis.  No anastomotic narrowing identified.  Visualized jejunal loops normal appearance.  No contrast extravasation identified during the exam.  No contrast opacification of the surgical drain.  IMPRESSION: Patent gastrojejunostomy anastomosis post distal gastrectomy.  No evidence of contrast extravasation to suggest anastomotic leak.   Electronically Signed   By: Lavonia Dana M.D.   On: 02/12/2015 12:11    Anti-infectives: Anti-infectives    Start     Dose/Rate Route Frequency Ordered Stop   02/09/15 0600  ciprofloxacin (CIPRO) IVPB 400 mg     400 mg 200 mL/hr over 60 Minutes Intravenous On call to O.R. 02/08/15 1715 02/09/15 1145      Assessment/Plan: s/p Procedure(s): DISTAL GASTRECTOMY WITH GASTROJEJUNOSTOMY; RIGHT HEMICOLECTOMY Impression: Stable on postoperative day 4. Upper GI series showed no anastomotic leak. Diet will be advanced. We will start weaning TPN. Final pathology pending.  LOS: 7 days    JENKINS,MARK A 02/13/2015

## 2015-02-13 NOTE — Care Management Note (Signed)
Case Management Note  Patient Details  Name: Heidi Cooper MRN: [id number] Date of Birth: 1956-02-08  Subjective/Objective:                    Action/Plan:   Expected Discharge Date:                  Expected Discharge Plan:  Home/Self Care  In-House Referral:  NA  Discharge planning Services  CM Consult  Post Acute Care Choice:  NA Choice offered to:  NA  DME Arranged:    DME Agency:     HH Arranged:    HH Agency:     Status of Service:  Completed, signed off  Medicare Important Message Given:    Date Medicare IM Given:    Medicare IM give by:    Date Additional Medicare IM Given:    Additional Medicare Important Message give by:     If discussed at [place] of Stay Meetings, dates discussed:  02/13/15  Additional Comments: Pt progressing with advancement of diet to liquids and weaning TPN. Will continue to follow for discharge planning needs. Christinia Gully Sullivan, RN 02/13/2015, 1:56 PM

## 2015-02-14 LAB — GLUCOSE, CAPILLARY
GLUCOSE-CAPILLARY: 88 mg/dL (ref 65–99)
GLUCOSE-CAPILLARY: 100 mg/dL — AB (ref 65–99)
Glucose-Capillary: 98 mg/dL (ref 65–99)
Glucose-Capillary: 90 mg/dL (ref 65–99)

## 2015-02-14 NOTE — Progress Notes (Signed)
5 Days Post-Op  Subjective: Patient tolerating full liquid diet well. Has not had a bowel movement yet.  Objective: Vital signs in last 24 hours: Temp:  [97.8 F (36.6 C)-98.4 F (36.9 C)] 98.4 F (36.9 C) (09/28 0534) Pulse Rate:  [68-70] 70 (09/28 0534) Resp:  [20] 20 (09/28 0534) BP: (88-110)/(45-63) 88/45 mmHg (09/28 0534) SpO2:  [99 %-100 %] 99 % (09/28 0534) Last BM Date: 02/06/15  Intake/Output from previous day: 09/27 0701 - 09/28 0700 In: 1782.8 [P.O.:600; I.V.:252.8; TPN:820] Out: 170 [Drains:170] Intake/Output this shift: Total I/O In: 240 [P.O.:240] Out: 85 [Drains:85]  General appearance: alert, cooperative and no distress Resp: clear to auscultation bilaterally Cardio: regular rate and rhythm, S1, S2 normal, no murmur, click, rub or gallop GI: Soft, incision healing well. JP drainage low and serosanguineous in nature.  Lab Results:   Recent Labs  02/12/15 0633 02/13/15 0618  WBC 7.3 5.6  HGB 9.5* 9.4*  HCT 27.5* 27.9*  PLT 190 200   BMET  Recent Labs  02/12/15 0633  NA 136  K 3.5  CL 107  CO2 26  GLUCOSE 120*  BUN 14  CREATININE 0.51  CALCIUM 7.8*   PT/INR No results for input(s): LABPROT, INR in the last 72 hours.  Studies/Results: No results found.  Anti-infectives: Anti-infectives    Start     Dose/Rate Route Frequency Ordered Stop   02/09/15 0600  ciprofloxacin (CIPRO) IVPB 400 mg     400 mg 200 mL/hr over 60 Minutes Intravenous On call to O.R. 02/08/15 1715 02/09/15 1145      Assessment/Plan: s/p Procedure(s): DISTAL GASTRECTOMY WITH GASTROJEJUNOSTOMY; RIGHT HEMICOLECTOMY Impression: Progressing very well. 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  Findings were discussed with both the patient and one of her daughters. Will be referred to oncology as an outpatient for further management treatment. If patient tolerates soft diet well, anticipate discharge in next 24-48 hours.  LOS: 8 days     JENKINS,MARK A 02/14/2015

## 2015-02-15 LAB — GLUCOSE, CAPILLARY
GLUCOSE-CAPILLARY: 85 mg/dL (ref 65–99)
Glucose-Capillary: 120 mg/dL — ABNORMAL HIGH (ref 65–99)

## 2015-02-15 NOTE — Care Management Note (Signed)
Case Management Note  Patient Details  Name: Thomasene Dubow MRN: [id number] Date of Birth: [birth year]  Subjective/Objective:                    Action/Plan:   Expected Discharge Date:                  Expected Discharge Plan:  Home/Self Care  In-House Referral:  NA  Discharge planning Services  CM Consult  Post Acute Care Choice:  NA Choice offered to:  NA  DME Arranged:    DME Agency:     HH Arranged:    HH Agency:     Status of Service:  Completed, signed off  Medicare Important Message Given:    Date Medicare IM Given:    Medicare IM give by:    Date Additional Medicare IM Given:    Additional Medicare Important Message give by:     If discussed at [place] of Stay Meetings, dates discussed: 02/15/15   Additional Comments:  Joylene Draft, RN 02/15/2015, 4:11 PM

## 2015-02-15 NOTE — Progress Notes (Signed)
6 Days Post-Op  Subjective: Starting to tolerate soft diet well. No emesis noted.  Objective: Vital signs in last 24 hours: Temp:  [98.2 F (36.8 C)-98.7 F (37.1 C)] 98.4 F (36.9 C) (09/29 0400) Pulse Rate:  [68-78] 78 (09/29 0400) Resp:  [18-20] 18 (09/29 0400) BP: (96-105)/(51-59) 100/52 mmHg (09/29 0400) SpO2:  [100 %] 100 % (09/29 0400) Last BM Date: 02/06/15  Intake/Output from previous day: 09/28 0701 - 09/29 0700 In: 480 [P.O.:480] Out: 1235 [Urine:900; Drains:335] Intake/Output this shift:    General appearance: alert, cooperative and no distress Resp: clear to auscultation bilaterally Cardio: regular rate and rhythm, S1, S2 normal, no murmur, click, rub or gallop GI: Soft. Incision healing well. JP drainage serous in nature. No bile noted. No purulent drainage noted.  Lab Results:   Recent Labs  02/13/15 0618  WBC 5.6  HGB 9.4*  HCT 27.9*  PLT 200   BMET No results for input(s): NA, K, CL, CO2, GLUCOSE, BUN, CREATININE, CALCIUM in the last 72 hours. PT/INR No results for input(s): LABPROT, INR in the last 72 hours.  Studies/Results: No results found.  Anti-infectives: Anti-infectives    Start     Dose/Rate Route Frequency Ordered Stop   02/09/15 0600  ciprofloxacin (CIPRO) IVPB 400 mg     400 mg 200 mL/hr over 60 Minutes Intravenous On call to O.R. 02/08/15 1715 02/09/15 1145      Assessment/Plan: s/p Procedure(s): DISTAL GASTRECTOMY WITH GASTROJEJUNOSTOMY; RIGHT HEMICOLECTOMY Impression: Continues to recover. Bowel function is slowly recovering. Awaiting full return of bowel function. Anticipate discharge in next 24-48 hours.  LOS: 9 days    Mariann Palo A 02/15/2015

## 2015-02-16 LAB — GLUCOSE, CAPILLARY
Glucose-Capillary: 108 mg/dL — ABNORMAL HIGH (ref 65–99)
Glucose-Capillary: 99 mg/dL (ref 65–99)

## 2015-02-16 MED ORDER — MAGNESIUM HYDROXIDE 400 MG/5ML PO SUSP
30.0000 mL | Freq: Two times a day (BID) | ORAL | Status: DC
  Administered 2015-02-16 – 2015-02-18 (×4): 30 mL via ORAL
  Filled 2015-02-16 (×6): qty 30

## 2015-02-16 MED ORDER — GLUCERNA SHAKE PO LIQD: 237.0000 mL | Freq: Two times a day (BID) | ORAL | Status: DC

## 2015-02-16 MED ORDER — POLYETHYLENE GLYCOL 3350 17 G PO PACK
17.0000 g | PACK | Freq: Two times a day (BID) | ORAL | Status: DC
  Administered 2015-02-16 – 2015-02-19 (×7): 17 g via ORAL
  Filled 2015-02-16 (×7): qty 1

## 2015-02-16 MED ORDER — METOCLOPRAMIDE HCL 5 MG/ML IJ SOLN
10.0000 mg | Freq: Three times a day (TID) | INTRAMUSCULAR | Status: DC
  Administered 2015-02-16 – 2015-02-19 (×10): 10 mg via INTRAVENOUS
  Filled 2015-02-16 (×10): qty 2

## 2015-02-16 NOTE — Progress Notes (Signed)
7 Days Post-Op  Subjective: Multiple episodes of emesis yesterday evening after eating fish for dinner. Has not had a bowel movement yet.  Objective: Vital signs in last 24 hours: Temp:  [97.8 F (36.6 C)-98.2 F (36.8 C)] 98 F (36.7 C) (09/30 0658) Pulse Rate:  [73-81] 81 (09/30 0658) Resp:  [17-18] 17 (09/30 0658) BP: (89-113)/(47-61) 113/61 mmHg (09/30 0658) SpO2:  [99 %-100 %] 99 % (09/30 0658) Last BM Date: 02/06/15  Intake/Output from previous day: 09/29 0701 - 09/30 0700 In: 3 [P.O.:480; I.V.:10] Out: 2180 [Urine:1700; Drains:480] Intake/Output this shift: Total I/O In: 10 [I.V.:10] Out: -   General appearance: alert, cooperative and no distress Resp: clear to auscultation bilaterally Cardio: regular rate and rhythm, S1, S2 normal, no murmur, click, rub or gallop GI: Soft, occasional bowel sounds appreciated. Incision healing well. JP drainage serous in nature.  Lab Results:  No results for input(s): WBC, HGB, HCT, PLT in the last 72 hours. BMET No results for input(s): NA, K, CL, CO2, GLUCOSE, BUN, CREATININE, CALCIUM in the last 72 hours. PT/INR No results for input(s): LABPROT, INR in the last 72 hours.  Studies/Results: No results found.  Anti-infectives: Anti-infectives    Start     Dose/Rate Route Frequency Ordered Stop   02/09/15 0600  ciprofloxacin (CIPRO) IVPB 400 mg     400 mg 200 mL/hr over 60 Minutes Intravenous On call to O.R. 02/08/15 1715 02/09/15 1145      Assessment/Plan: s/p Procedure(s): DISTAL GASTRECTOMY WITH GASTROJEJUNOSTOMY; RIGHT HEMICOLECTOMY Impression: Emesis secondary to gastric and bowel dysfunction. I suspect patient had a component of gastroparesis due to her many months of gastric outlet obstruction.  Plan: Maalox, Reglan, and milk of magnesia have been ordered. We'll leave patient on soft diet and told her to eat small bites.  LOS: 10 days    JENKINS,MARK A 02/16/2015

## 2015-02-16 NOTE — Progress Notes (Addendum)
Follow Up Nutrition Assessment  DOCUMENTATION CODES:  Underweight, Severe malnutrition in context of chronic illness  INTERVENTION:  Gave education regarding supplementation and dumping syndrome.   Glucerna Shake po BID, each supplement provides 220 kcal and 10 grams of protein  Magic cup daily, each supplement provides 290 kcal and 9 grams of protein  NUTRITION DIAGNOSIS:  Inadequate oral intake   progressing  GOAL:  Patient will meet greater than or equal to 90% of their needs  MONITOR:  Oral intake, Diet advancement, Weight trends, I & O's, Labs,   ASSESSMENT:  59 y/o female w/ no significant PMHx presents with reported several month history of nausea vomiting, abdominal pain, 35 pound weight loss. Yesterday had EGD found to have gastric outlet obstruction. Per GI, npo, TPN, picc line placed on 9/20  Interval Hx 9/27: Pathology positive for adenocarcinoma. 9/23 underwent partial gastrectomy w/ Roux en Y Gastrojejunostomy and right hemicolectomy. Now Post op Day 4. Had NG tube for suction which is now removed. TPN beginning to be weaned. Today advanced to full liquid diet  Interval HX: 9/30: TPN stopped. On Soft diet. Eating 50% meals. Still no BM. Vomiting.   Patient reports that she tolerated the full liquid diet, but when she had baked fish yesterday she got sick and has had multiple episodes of emesis.  Went over the supplements patient should take when discharged: MVI w/ iron, calcium Citrate w/ Vit D, B12, and Vitamin D. Typical RYGB dosage amounts were reccommended to start, however, due to her unique situation, the exact dosage of the supplements may to be altered. Follow labs.  Discussed what Dumping Syndrome is, the symptoms, and what foods may trigger it. Recommend avoiding concentrated sweets or eating very small amounts with other foods.   Patient stated that oncology has not yet seen her. Educated pt that she requires much increased protein needs, not just due  recent surgery, but also because of likely impending cancer treatments. Emphasized that she eat protein first at each meal. She reports that she will likely be seen at [facility] for her malignancy and RD will continue to follow as OP.    Diet Order:  DIET SOFT Room service appropriate?: Yes; Fluid consistency:: Thin  Skin:  Surgical Incision abdomen  Last BM:  9/20-no BM since surgery.   Height:  Ht Readings from Last 1 Encounters:  02/06/15 5\' 6"  (1.676 m)   Weight:  Wt Readings from Last 1 Encounters:  02/06/15 103 lb 4.8 oz (46.857 kg)   Wt Readings from Last 10 Encounters:  02/06/15 103 lb 4.8 oz (46.857 kg)  01/23/15 111 lb (50.349 kg)  01/21/15 108 lb (48.988 kg)  01/15/15 108 lb 9.6 oz (49.261 kg)   Ideal Body Weight:  59.1 kg  BMI:  Body mass index is 16.68 kg/(m^2).  Estimated Nutritional Needs:  Kcal:  1650-1850 kcals (35-40 kcal/kg) Protein:  71-89 g (1.2-1.5 g/kg bw) Fluid:  1.7-1.9 liters  EDUCATION NEEDS:  No education needs identified at this time  Burtis Junes RD, LDN Nutrition Pager: [phone] 02/16/2015 10:06 AM

## 2015-02-17 LAB — BASIC METABOLIC PANEL
ANION GAP: 8 (ref 5–15)
BUN: 25 mg/dL — AB (ref 6–20)
CHLORIDE: 97 mmol/L — AB (ref 101–111)
CO2: 29 mmol/L (ref 22–32)
Calcium: 8 mg/dL — ABNORMAL LOW (ref 8.9–10.3)
Creatinine, Ser: 0.92 mg/dL (ref 0.44–1.00)
GFR calc Af Amer: >60 mL/min (ref >60)
GLUCOSE: 105 mg/dL — AB (ref 65–99)
POTASSIUM: 3.8 mmol/L (ref 3.5–5.1)
Sodium: 134 mmol/L — ABNORMAL LOW (ref 135–145)

## 2015-02-17 LAB — CBC
HEMATOCRIT: 28.1 % — AB (ref 36.0–46.0)
HEMOGLOBIN: 9.7 g/dL — AB (ref 12.0–15.0)
MCH: 33.9 pg (ref 26.0–34.0)
MCHC: 34.5 g/dL (ref 30.0–36.0)
MCV: 98.3 fL (ref 78.0–100.0)
PLATELETS: 330 10*3/uL (ref 150–400)
RBC: 2.86 MIL/uL — AB (ref 3.87–5.11)
RDW: 12.8 % (ref 11.5–15.5)
WBC: 3.7 10*3/uL — AB (ref 4.0–10.5)

## 2015-02-17 LAB — GLUCOSE, CAPILLARY
GLUCOSE-CAPILLARY: 99 mg/dL (ref 65–99)
GLUCOSE-CAPILLARY: 110 mg/dL — AB (ref 65–99)
Glucose-Capillary: 99 mg/dL (ref 65–99)

## 2015-02-17 LAB — MAGNESIUM: Magnesium: 2.3 mg/dL (ref 1.7–2.4)

## 2015-02-17 LAB — PHOSPHORUS: Phosphorus: 4.8 mg/dL — ABNORMAL HIGH (ref 2.5–4.6)

## 2015-02-17 MED ORDER — KCL IN DEXTROSE-NACL 20-5-0.45 MEQ/L-%-% IV SOLN
INTRAVENOUS | Status: DC
  Administered 2015-02-17 – 2015-02-18 (×2): via INTRAVENOUS

## 2015-02-17 MED ORDER — ONDANSETRON HCL 4 MG PO TABS
4.0000 mg | ORAL_TABLET | Freq: Four times a day (QID) | ORAL | Status: DC | PRN
  Administered 2015-02-17 – 2015-02-18 (×3): 4 mg via ORAL
  Filled 2015-02-17 (×3): qty 1

## 2015-02-17 MED ORDER — BISACODYL 10 MG RE SUPP
10.0000 mg | Freq: Two times a day (BID) | RECTAL | Status: DC
  Administered 2015-02-17 – 2015-02-18 (×4): 10 mg via RECTAL
  Filled 2015-02-17 (×5): qty 1

## 2015-02-17 MED ORDER — FLEET ENEMA 7-19 GM/118ML RE ENEM
1.0000 | ENEMA | Freq: Once | RECTAL | Status: AC
  Administered 2015-02-17: 1 via RECTAL

## 2015-02-17 MED ORDER — SODIUM CHLORIDE 0.9 % IV BOLUS (SEPSIS)
500.0000 mL | Freq: Once | INTRAVENOUS | Status: AC
  Administered 2015-02-17: 500 mL via INTRAVENOUS

## 2015-02-17 NOTE — Progress Notes (Signed)
Fleets enema administered, pt retained whole amount (approximately 178ml) for 45minutes.  Pt up to bedside commode, small bowel movement.

## 2015-02-17 NOTE — Progress Notes (Signed)
8 Days Post-Op  Subjective: Still with emesis. Is passing flatus, but no bowel movement.  Objective: Vital signs in last 24 hours: Temp:  [97.8 F (36.6 C)-98.1 F (36.7 C)] 98 F (36.7 C) (10/01 0612) Pulse Rate:  [82-95] 82 (10/01 0612) Resp:  [18-20] 18 (10/01 0612) BP: (97-105)/(51-57) 104/51 mmHg (10/01 0612) SpO2:  [98 %-100 %] 100 % (10/01 0612) Last BM Date: 02/06/15  Intake/Output from previous day: 09/30 0701 - 10/01 0700 In: 1220 [P.O.:480; I.V.:740] Out: 700 [Urine:300; Drains:400] Intake/Output this shift:    General appearance: alert, cooperative and no distress Resp: clear to auscultation bilaterally Cardio: regular rate and rhythm, S1, S2 normal, no murmur, click, rub or gallop GI: Soft, mildly distended. No soft, mildly distended but not tense. Incision healing well. JP drainage serous in nature.  Lab Results:  No results for input(s): WBC, HGB, HCT, PLT in the last 72 hours. BMET No results for input(s): NA, K, CL, CO2, GLUCOSE, BUN, CREATININE, CALCIUM in the last 72 hours. PT/INR No results for input(s): LABPROT, INR in the last 72 hours.  Studies/Results: No results found.  Anti-infectives: Anti-infectives    Start     Dose/Rate Route Frequency Ordered Stop   02/09/15 0600  ciprofloxacin (CIPRO) IVPB 400 mg     400 mg 200 mL/hr over 60 Minutes Intravenous On call to O.R. 02/08/15 1715 02/09/15 1145      Assessment/Plan: s/p Procedure(s): DISTAL GASTRECTOMY WITH GASTROJEJUNOSTOMY; RIGHT HEMICOLECTOMY Impression: Postoperative ileus, slowly resolved. We'll reestablish IV fluids. Check labs.  LOS: 11 days    Kiyon Fidalgo A 02/17/2015

## 2015-02-18 LAB — GLUCOSE, CAPILLARY
GLUCOSE-CAPILLARY: 99 mg/dL (ref 65–99)
Glucose-Capillary: 90 mg/dL (ref 65–99)
Glucose-Capillary: 78 mg/dL (ref 65–99)

## 2015-02-18 MED ORDER — SODIUM CHLORIDE 0.9 % IJ SOLN
INTRAMUSCULAR | Status: AC
  Filled 2015-02-18: qty 15

## 2015-02-18 NOTE — Progress Notes (Signed)
9 Days Post-Op  Subjective: Patient had multiple bowel movements yesterday. She does feel better, but still fatigued. Her nausea is improved but still present.  Objective: Vital signs in last 24 hours: Temp:  [97.7 F (36.5 C)-98.6 F (37 C)] 98.6 F (37 C) (10/02 0534) Pulse Rate:  [78-83] 83 (10/02 0534) Resp:  [18-20] 18 (10/02 0534) BP: (92-104)/(32-56) 92/32 mmHg (10/02 0534) SpO2:  [97 %-100 %] 97 % (10/02 0534) Last BM Date: 02/17/15  Intake/Output from previous day: 10/01 0701 - 10/02 0700 In: 2114.5 [P.O.:480; I.V.:1134.5; IV Piggyback:500] Out: 340 [Drains:340] Intake/Output this shift: Total I/O In: 20 [I.V.:20] Out: 70 [Drains:70]  General appearance: alert, cooperative, fatigued and no distress Resp: clear to auscultation bilaterally Cardio: regular rate and rhythm, S1, S2 normal, no murmur, click, rub or gallop GI: Soft, occasional bowel sounds appreciated. Incision healing well. JP drain removed.  Lab Results:   Recent Labs  02/17/15 1126  WBC 3.7*  HGB 9.7*  HCT 28.1*  PLT 330   BMET  Recent Labs  02/17/15 1126  NA 134*  K 3.8  CL 97*  CO2 29  GLUCOSE 105*  BUN 25*  CREATININE 0.92  CALCIUM 8.0*   PT/INR No results for input(s): LABPROT, INR in the last 72 hours.  Studies/Results: No results found.  Anti-infectives: Anti-infectives    Start     Dose/Rate Route Frequency Ordered Stop   02/09/15 0600  ciprofloxacin (CIPRO) IVPB 400 mg     400 mg 200 mL/hr over 60 Minutes Intravenous On call to O.R. 02/08/15 1715 02/09/15 1145      Assessment/Plan: s/p Procedure(s): DISTAL GASTRECTOMY WITH GASTROJEJUNOSTOMY; RIGHT HEMICOLECTOMY Impression: Postoperative ileus, resolving.  Plan: Encouraged patient to ambulate today. Will be able to shower. Anticipate discharge in next 24-48 hours. Fluid bolus given for mild hypovolemia.  LOS: 12 days    Amilah Greenspan A 02/18/2015

## 2015-02-19 LAB — GLUCOSE, CAPILLARY: Glucose-Capillary: 84 mg/dL (ref 65–99)

## 2015-02-19 MED ORDER — HYDROCODONE-ACETAMINOPHEN 5-325 MG PO TABS: 1.0000 | ORAL_TABLET | ORAL | Status: DC | PRN

## 2015-02-19 MED ORDER — ONDANSETRON 4 MG PO TBDP: 4.0000 mg | ORAL_TABLET | Freq: Three times a day (TID) | ORAL | Status: DC | PRN

## 2015-02-19 NOTE — Care Management Note (Signed)
Case Management Note  Patient Details  Name: Tawana Pasch MRN: [id number] Date of Birth: [birth year]  Subjective/Objective:                    Action/Plan:   Expected Discharge Date:                  Expected Discharge Plan:  Home/Self Care  In-House Referral:  NA  Discharge planning Services  CM Consult  Post Acute Care Choice:  NA Choice offered to:  NA  DME Arranged:    DME Agency:     HH Arranged:    HH Agency:     Status of Service:  Completed, signed off  Medicare Important Message Given:    Date Medicare IM Given:    Medicare IM give by:    Date Additional Medicare IM Given:    Additional Medicare Important Message give by:     If discussed at [place] of Stay Meetings, dates discussed:    Additional Comments: Pt discharged home today. No CM needs noted. Christinia Gully Pendleton, RN 02/19/2015, 10:44 AM

## 2015-02-19 NOTE — Discharge Summary (Signed)
Physician Discharge Summary  Patient ID: Heidi Cooper MRN: [id number] DOB/AGE: 21-Apr-1956 59 y.o.  Admit date: 02/06/2015 Discharge date: 02/19/2015  Admission Diagnoses: Gastric outlet obstruction  Discharge Diagnoses: Gastric carcinoma, T4, N1, M0 Active Problems:   Nausea with vomiting   Gastric outlet obstruction   Malnutrition of moderate degree (HCC)   GERD (gastroesophageal reflux disease)   Hypokalemia   Chronic migraine   Discharged Condition: good  Hospital Course: Patient is a 59 year old white female who had a several month history of worsening nausea, vomiting, and weight loss. She underwent an EGD with biopsy by Dr. Oneida Alar on 02/06/2015. Dr. Oneida Alar at the patient admitted to the hospital the same day as she could not pass the scope through the pylorus. Surgery was consulted. Prior to going to the operating room, biopsies of the pylorus revealed gastric carcinoma. She subsequently underwent an antrectomy with Billroth II Roux-en-Y gastrojejunostomy, right hemicolectomy on 02/09/2015. She tolerated the surgery remarkably well. Her postoperative course was remarkable for a postoperative ileus which stopped bleeding did resolve with cathartics and time. Final pathology revealed a T4, N1, N0 signet cell carcinoma. The patient is being discharged home on 02/19/2015 in good and improving condition.  Treatments: surgery: EGD with biopsy on 02/06/2015, antrectomy with Billroth II Roux-en-Y gastrojejunostomy, right hemicolectomy on 02/09/2015  Discharge Exam: Blood pressure 84/54, pulse 77, temperature 98.6 F (37 C), temperature source Oral, resp. rate 16, height 5\' 6"  (1.676 m), weight 46.857 kg (103 lb 4.8 oz), SpO2 97 %. General appearance: alert, cooperative and no distress Resp: clear to auscultation bilaterally Cardio: regular rate and rhythm, S1, S2 normal, no murmur, click, rub or gallop GI: Soft, incision healing well. Bowel sounds active.  Disposition: 01-Home or Self  Care     Medication List    STOP taking these medications        polyethylene glycol-electrolytes 420 G solution  Commonly known as:  NuLYTELY/GoLYTELY      TAKE these medications        fluticasone 50 MCG/ACT nasal spray  Commonly known as:  FLONASE  Place 2 sprays into the nose daily as needed for allergies.     HYDROcodone-acetaminophen 5-325 MG tablet  Commonly known as:  NORCO/VICODIN  Take 1-2 tablets by mouth every 4 (four) hours as needed for moderate pain.     metoCLOPramide 5 MG/5ML solution  Commonly known as:  REGLAN  Take 10 mLs (10 mg total) by mouth 3 (three) times daily before meals.     ondansetron 4 MG disintegrating tablet  Commonly known as:  ZOFRAN ODT  Take 1 tablet (4 mg total) by mouth every 8 (eight) hours as needed for nausea.     pantoprazole 40 MG tablet  Commonly known as:  PROTONIX  Take 40 mg by mouth at bedtime.     promethazine 12.5 MG tablet  Commonly known as:  PHENERGAN  Take 1 tablet (12.5 mg total) by mouth every 6 (six) hours as needed for nausea or vomiting.     ranitidine 300 MG tablet  Commonly known as:  ZANTAC  Take 300 mg by mouth 2 (two) times daily.     TOPAMAX 25 MG tablet  Generic drug:  topiramate  Take 25 mg by mouth at bedtime.           Follow-up Information    Follow up with Jamesetta So, MD On 02/27/2015.   Specialty:  General Surgery   Why:  at 10:45 am   Contact information:  [address] [place] [address] [phone]       Signed: Aviva Signs A 02/19/2015, 10:58 AM

## 2015-02-19 NOTE — Progress Notes (Signed)
PICC line removed RUA without difficulty, vaseline gauze and 4x4 applied, pressure held x 2 minutes, secured with hypafix tape, instructed to leave dressing in place x 24 hours, verbalized understanding.

## 2015-02-19 NOTE — Progress Notes (Signed)
02/19/2015  Heidi Cooper to be D/C'd Home per MD order.  Discussed prescriptions and follow up appointments with the patient. Prescriptions given to patient, medication list explained in detail. Pt verbalized understanding.    Medication List    STOP taking these medications        polyethylene glycol-electrolytes 420 G solution  Commonly known as:  NuLYTELY/GoLYTELY      TAKE these medications        fluticasone 50 MCG/ACT nasal spray  Commonly known as:  FLONASE  Place 2 sprays into the nose daily as needed for allergies.     HYDROcodone-acetaminophen 5-325 MG tablet  Commonly known as:  NORCO/VICODIN  Take 1-2 tablets by mouth every 4 (four) hours as needed for moderate pain.     metoCLOPramide 5 MG/5ML solution  Commonly known as:  REGLAN  Take 10 mLs (10 mg total) by mouth 3 (three) times daily before meals.     ondansetron 4 MG disintegrating tablet  Commonly known as:  ZOFRAN ODT  Take 1 tablet (4 mg total) by mouth every 8 (eight) hours as needed for nausea.     pantoprazole 40 MG tablet  Commonly known as:  PROTONIX  Take 40 mg by mouth at bedtime.     promethazine 12.5 MG tablet  Commonly known as:  PHENERGAN  Take 1 tablet (12.5 mg total) by mouth every 6 (six) hours as needed for nausea or vomiting.     ranitidine 300 MG tablet  Commonly known as:  ZANTAC  Take 300 mg by mouth 2 (two) times daily.     TOPAMAX 25 MG tablet  Generic drug:  topiramate  Take 25 mg by mouth at bedtime.        Filed Vitals:   02/19/15 0800  BP: 84/54  Pulse: 77  Temp:   Resp: 16    Skin clean, dry and intact without evidence of skin break down, no evidence of skin tears noted. IV catheter discontinued intact. Site without signs and symptoms of complications. Dressing and pressure applied. Pt denies pain at this time. No complaints noted.  An After Visit Summary was printed and given to the patient. Patient escorted via [place], and D/C home via private auto.  Dola Argyle

## 2015-02-19 NOTE — Discharge Instructions (Signed)
Partial Antrectomy or Subtotal Gastrectomy, Care After Refer to this sheet in the next few weeks. These discharge instructions provide you with general information on caring for yourself after you leave the hospital. Your caregiver may also give you specific instructions. Your treatment has been planned according to the most current medical practices available, but unavoidable complications sometimes occur. If you have any problems or questions after discharge, please call your caregiver. Recovery from antrectomy, partial, or subtotal gastrectomy can take up to 8 weeks or more. Allow time to heal and pay close attention to your diet during recovery. Most patients adjust well over time. HOME CARE INSTRUCTIONS  Everyone recovers at a different pace. Proper self-care will help you to recover fully and return to your normal activities.  Follow your caregiver's instructions.  Rest often, but try to move about each day.  Gradually add light activity to your daily routine.  Take pain or other medicines as directed. Do not drive if you are taking narcotics for pain.  Keep the incision area(s) clean, dry, and protected. Follow your caregiver's instructions for incision care and changing your bandages (dressings).  Follow up with your caregiver as directed. Regular blood tests to check for vitamin deficiencies and vitamin B injections are sometimes necessary.  You may develop a scar(s) at the incision site(s). Follow your caregiver's instructions for scar care after the incision area has healed.  You may gradually resume most activities, including sexual activity, as directed.  Menstrual cycles are commonly disrupted after surgery. You may be off schedule for up to a year.  Always discuss your medicines with your caregiver before taking them.  If you smoke, quit. Smoking slows the healing process. Special diet instructions:  Eat small amounts of food throughout the day instead of large meals. If  too much food moves quickly through your system, this can lead to "dumping syndrome" (cramps, nausea, dizziness, discomfort).  Your caregiver will help you understand the best foods to eat to prevent complications. Eat foods high in calcium, iron, vitamin C, and vitamin D.  It is common to experience heartburn and/or diarrhea at first. Ask your doctor about foods or medicines to help lessen these symptoms.  Keeping a food diary may help you to know which foods to avoid.  Follow all of your caregiver's dietary instructions. SEEK MEDICAL CARE IF:   You are having trouble caring for your incision(s).  Your pain is not controlled by the medicines you have been given.  You have an oral temperature above 102 F (38.9 C).  You develop chills.  You are feeling sick to your stomach (nauseous).  You are throwing up (vomiting).  You notice bleeding, skin irritation, drainage, redness, or pain.  You have abdominal pain, bloating, pressure, or cramping.  Your stools do not become firmer over time.  You have frequent diarrhea or heartburn.  You experience lightheadedness.  You experience mental confusion.  You have other new symptoms.  You have questions or concerns. SEEK IMMEDIATE MEDICAL CARE IF:   Abdominal pain does not go away or becomes severe.  You have an oral temperature above 102 F (38.9 C), not controlled by medicine.  Repeated vomiting occurs.  You develop an irregular heartbeat or chest pain.  There is swelling or pain in your legs, calves, or feet. MAKE SURE YOU:   Understand these instructions.  Will watch your condition.  Will get help right away if you are not doing well or get worse. Document Released: 07/30/2009 Document Revised: 02/23/2013  Document Reviewed: 07/30/2009 [facility] ([facility]) Patient Information 2015 [place]. This information is not intended to replace advice given to you by your health care provider. Make sure you discuss any questions you  have with your health care provider.

## 2015-02-27 ENCOUNTER — Encounter (HOSPITAL_COMMUNITY): Payer: PRIVATE HEALTH INSURANCE | Attending: Oncology | Admitting: Oncology

## 2015-02-27 ENCOUNTER — Encounter (HOSPITAL_COMMUNITY): Payer: Self-pay | Admitting: Oncology

## 2015-02-27 VITALS — BP 105/90 | HR 79 | Temp 97.5°F | Resp 15 | Ht 62.75 in | Wt 98.2 lb

## 2015-02-27 DIAGNOSIS — R634 Abnormal weight loss: Secondary | ICD-10-CM

## 2015-02-27 DIAGNOSIS — R630 Anorexia: Secondary | ICD-10-CM

## 2015-02-27 DIAGNOSIS — D509 Iron deficiency anemia, unspecified: Secondary | ICD-10-CM | POA: Insufficient documentation

## 2015-02-27 DIAGNOSIS — E876 Hypokalemia: Secondary | ICD-10-CM | POA: Diagnosis not present

## 2015-02-27 DIAGNOSIS — F1020 Alcohol dependence, uncomplicated: Secondary | ICD-10-CM

## 2015-02-27 DIAGNOSIS — C169 Malignant neoplasm of stomach, unspecified: Secondary | ICD-10-CM

## 2015-02-27 DIAGNOSIS — Z87891 Personal history of nicotine dependence: Secondary | ICD-10-CM

## 2015-02-27 DIAGNOSIS — C163 Malignant neoplasm of pyloric antrum: Secondary | ICD-10-CM | POA: Diagnosis not present

## 2015-02-27 HISTORY — DX: Malignant neoplasm of stomach, unspecified: C16.9

## 2015-02-27 LAB — COMPREHENSIVE METABOLIC PANEL
ALK PHOS: 59 U/L (ref 38–126)
ALT: 16 U/L (ref 14–54)
AST: 16 U/L (ref 15–41)
Albumin: 3.4 g/dL — ABNORMAL LOW (ref 3.5–5.0)
Anion gap: 6 (ref 5–15)
BUN: 15 mg/dL (ref 6–20)
CALCIUM: 8.6 mg/dL — AB (ref 8.9–10.3)
CO2: 23 mmol/L (ref 22–32)
CREATININE: 0.78 mg/dL (ref 0.44–1.00)
Chloride: 109 mmol/L (ref 101–111)
Glucose, Bld: 91 mg/dL (ref 65–99)
Potassium: 3.2 mmol/L — ABNORMAL LOW (ref 3.5–5.1)
Sodium: 138 mmol/L (ref 135–145)
Total Bilirubin: 0.3 mg/dL (ref 0.3–1.2)
Total Protein: 6.1 g/dL — ABNORMAL LOW (ref 6.5–8.1)

## 2015-02-27 LAB — CBC WITH DIFFERENTIAL/PLATELET
Basophils Absolute: 0 10*3/uL (ref 0.0–0.1)
Basophils Relative: 1 %
Eosinophils Absolute: 0.1 10*3/uL (ref 0.0–0.7)
Eosinophils Relative: 1 %
HCT: 29.3 % — ABNORMAL LOW (ref 36.0–46.0)
HEMOGLOBIN: 9.8 g/dL — AB (ref 12.0–15.0)
LYMPHS ABS: 2 10*3/uL (ref 0.7–4.0)
LYMPHS PCT: 35 %
MCH: 33.1 pg (ref 26.0–34.0)
MCHC: 33.4 g/dL (ref 30.0–36.0)
MCV: 99 fL (ref 78.0–100.0)
Monocytes Absolute: 0.4 10*3/uL (ref 0.1–1.0)
Monocytes Relative: 8 %
NEUTROS ABS: 3.1 10*3/uL (ref 1.7–7.7)
NEUTROS PCT: 55 %
Platelets: 411 10*3/uL — ABNORMAL HIGH (ref 150–400)
RBC: 2.96 MIL/uL — AB (ref 3.87–5.11)
RDW: 14.1 % (ref 11.5–15.5)
WBC: 5.6 10*3/uL (ref 4.0–10.5)

## 2015-02-27 LAB — IRON AND TIBC
Iron: 35 ug/dL (ref 28–170)
SATURATION RATIOS: 14 % (ref 10.4–31.8)
TIBC: 258 ug/dL (ref 250–450)
UIBC: 223 ug/dL

## 2015-02-27 LAB — FERRITIN: FERRITIN: 42 ng/mL (ref 11–307)

## 2015-02-27 LAB — FOLATE: Folate: 12.8 ng/mL (ref >5.9)

## 2015-02-27 LAB — VITAMIN B12: Vitamin B-12: 1093 pg/mL — ABNORMAL HIGH (ref 180–914)

## 2015-02-27 NOTE — Assessment & Plan Note (Addendum)
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 Negative margins at time of surgery, and 2/14 positive lymph nodes for metastatic disease.  Staging completed for information available to me today.  Oncology history developed.  Labs today: CBC diff, CMET, CEA, CA 19-9  Staging needs completed: CT CAP with contrast.  No allergy to IV dye.  We discussed treatment options including 5FU + XRT (assuming an M0 status CT scans).  She is agreeable to this route which is most appropriate.  Return in 1 week for follow-up after CT imaging.

## 2015-02-27 NOTE — Patient Instructions (Signed)
[place] at [facility] Discharge Instructions  RECOMMENDATIONS MADE BY THE CONSULTANT AND ANY TEST RESULTS WILL BE SENT TO YOUR REFERRING PHYSICIAN.  Exam and discussion by Robynn Pane, PA-C and Dr. Ancil Linsey. Will check some labs today and get CT scans of your chest, abdomen and pelvis on Monday Will ask Dr. Arnoldo Morale to insert a port a catheter Will consult Radiation Oncology  Follow-up in 1 week to discuss test results and treatment plans.  Thank you for choosing [place] at [facility] to provide your oncology and hematology care.  To afford each patient quality time with our provider, please arrive at least 15 minutes before your scheduled appointment time.    You need to re-schedule your appointment should you arrive 10 or more minutes late.  We strive to give you quality time with our providers, and arriving late affects you and other patients whose appointments are after yours.  Also, if you no show three or more times for appointments you may be dismissed from the clinic at the providers discretion.     Again, thank you for choosing [facility].  Our hope is that these requests will decrease the amount of time that you wait before being seen by our physicians.       _____________________________________________________________  Should you have questions after your visit to [facility], please contact our office at [phone] between the hours of 8:30 a.m. and 4:30 p.m.  Voicemails left after 4:30 p.m. will not be returned until the following business day.  For prescription refill requests, have your pharmacy contact our office.

## 2015-02-27 NOTE — Progress Notes (Signed)
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 Negative margins at time of surgery, and 2/14 positive lymph nodes for metastatic disease.  HISTORY OF PRESENT ILLNESS:   Heidi Cooper is a 59 year old white female with a past medical history significant for GERD who is referred to [facility] following diagnosis of signet ring cell carcinoma of gastric antrum, clinically Stage IIIA measuring 4.5 cm (1 cm in depth) with invasion into the visceral peritoneum, LVI+. Negative margins at time of surgery, and 2/14 positive lymph nodes for metastatic disease.  Chart reviewed  I personally reviewed and went over laboratory results with the patient.  The results are noted within this dictation.  No pre-operative CEA or CA 19-9.  I personally reviewed and went over radiographic studies with the patient.  The results are noted within this dictation.    I personally reviewed and went over pathology results with the patient.    Gastric cancer ([place])   01/21/2015 Imaging CT abd/pelvis- Gastroptosis with massive enlargement of the stomach, but no acute bowel obstruction. Query severe gastroparesis. See coronal image 33. No other acute or inflammatory process identified in the abdomen or pelvis. Diverticulosis   02/06/2015 - 02/19/2015 Hospital Admission Nausea with vomiting, Gastric outlet obstruction, Malnutrition of moderate degree ([place])   02/06/2015 Procedure EGD by Dr. Oneida Alar   02/06/2015 Pathology Results Stomach, biopsy, pre-pylori - ULCERATED, POORLY DIFFERENTIATED ADENOCARCINOMA   02/09/2015 Definitive Surgery Dr. Arnoldo Morale- partial gastrectomy (distal antrectomy) with  Roux-en-Y gastrojejunostomy, right hemicolectomy.   02/09/2015 Pathology Results Stomach, resection for tumor, distal stomach SIGNET RING CELL CARCINOMA (4.5 CM, 1 CM IN DEPTH) TUMOR INVADES VISCERAL PERITONEUM LYMPHOVASCULAR INVASION IDENTIFIED,MARGINS OF RESECTION ARE NEGATIVE FOR TUMOR METASTATIC CARCINOMA IN 2/14   02/09/2015 Pathology Results HER NEGATIVE   02/12/2015 Imaging DG UGI- Patent gastrojejunostomy anastomosis post distal gastrectomy. No evidence of contrast extravasation to suggest anastomotic leak.    Patient's story is as follows:  In May 2016 she noted that she was having "issues."  She saw her primary care provider, Dr. Geradine Girt, who performed a RUQ [place] which was negative for any pathology.  According to the patient, she had issues seeing a Garment/textile technologist for an EGD.  Her nausea, vomiting, and weight loss progressed with progressive epigastric pain.  She presented to the [facility] ED on 9/4 at which time CT imaging of abd/pelvis was performed.  This led to a referral to Dr. Oneida Alar, GI.  She underwent EGD on 9/20 leading to visualization of gastric mass with biopsy.  This provided the diagnosis of adenocarcinoma of stomach.  This was followed by partial gastrectomy and Roux-en-Y by Dr. Arnoldo Morale on 02/09/2015.  From May to now, she has lost 50 lbs she reports.  Since surgery, her abdominal pain is much improved.  She notes some mild abdominal pain at the incision site.  She denies any B symptoms and chest pain.  Her bowel are working well without blood in stool or black tarry stool.  She denies any urinary complaints.   PAST MEDICAL HISTORY:   Past Medical History  Diagnosis Date  . Migraine   .  Gastric cancer ([place]) 02/27/2015    ALLERGIES: Allergies  Allergen Reactions  . Other Itching    Mycins cause itching   . Sulfa Antibiotics Other (See Comments)    Childhood reaction   . Penicillins Rash    Childhood reaction       MEDICATIONS: I have reviewed the patient's current  medications.    Current Outpatient Prescriptions on File Prior to Visit  Medication Sig Dispense Refill  . fluticasone (FLONASE) 50 MCG/ACT nasal spray Place 2 sprays into the nose daily as needed for allergies.     Marland Kitchen HYDROcodone-acetaminophen (NORCO/VICODIN) 5-325 MG tablet Take 1-2 tablets by mouth every 4 (four) hours as needed for moderate pain. 50 tablet 0  . ondansetron (ZOFRAN ODT) 4 MG disintegrating tablet Take 1 tablet (4 mg total) by mouth every 8 (eight) hours as needed for nausea. 30 tablet 1  . pantoprazole (PROTONIX) 40 MG tablet Take 40 mg by mouth at bedtime.     . ranitidine (ZANTAC) 300 MG tablet Take 300 mg by mouth 2 (two) times daily.     Marland Kitchen topiramate (TOPAMAX) 25 MG tablet Take 25 mg by mouth at bedtime.     . metoCLOPramide (REGLAN) 5 MG/5ML solution Take 10 mLs (10 mg total) by mouth 3 (three) times daily before meals. (Patient not taking: Reported on 02/27/2015) 120 mL 0  . promethazine (PHENERGAN) 12.5 MG tablet Take 1 tablet (12.5 mg total) by mouth every 6 (six) hours as needed for nausea or vomiting. (Patient not taking: Reported on 02/27/2015) 20 tablet 0   No current facility-administered medications on file prior to visit.     PAST SURGICAL HISTORY Past Surgical History  Procedure Laterality Date  . Tonsillectomy    . Esophagogastroduodenoscopy N/A 02/06/2015    Procedure: ESOPHAGOGASTRODUODENOSCOPY (EGD);  Surgeon: Danie Binder, MD;  [facility];  Service: Endoscopy;  Laterality: N/A;  . Gastrectomy N/A 02/09/2015    Procedure: DISTAL GASTRECTOMY WITH GASTROJEJUNOSTOMY; RIGHT HEMICOLECTOMY;  Surgeon: Aviva Signs, MD;  Location: [facility] ORS;  Service: General;  Laterality: N/A;    FAMILY HISTORY: Family History  Problem Relation Age of Onset  . Heart disease Mother   . Heart disease Father   . Colon cancer Neg Hx    She has never been married.  She has no children.  She has two younger sisters ages 4 and 62.  Both are healthy.  One lives in  [place] and another is in [place].  Her father passed at a young age of 37 secondary to a MI.  Her mother is deceased at the age of 78 from a MI as well.  She denies any family history of malignancies.  SOCIAL HISTORY:  reports that she quit smoking about 3 months ago. She has never used smokeless tobacco. She reports that she does not drink alcohol or use illicit drugs.  She notes a history of tobacco abuse, smoking 1 ppd x 45 years.  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 She admits to a distant history of marijuana use.  PERFORMANCE STATUS: The patient's performance status is 1 - Symptomatic but completely ambulatory  PHYSICAL EXAM: Most Recent Vital Signs: Blood pressure 105/90, pulse 79, temperature 97.5 F (36.4 C), temperature source Oral, resp. rate 15, height 5' 2.75" (1.594 m), weight 98 lb 3.2 oz (44.543 kg), SpO2 100 %. General appearance: alert, cooperative, appears stated age, cachectic, no distress, pale and accomapnied by her sister Head:  Normocephalic, without obvious abnormality, atraumatic Eyes: negative findings: lids and lashes normal, conjunctivae and sclerae normal and corneas clear Neck: no adenopathy and supple, symmetrical, trachea midline Lungs: clear to auscultation bilaterally and normal percussion bilaterally Heart: regular rate and rhythm, S1, S2 normal, no murmur, click, rub or gallop Abdomen: soft, non-tender; bowel sounds normal; no masses,  no organomegaly and surgical scar is well healed without any erythema. Extremities: extremities normal, atraumatic, no cyanosis or edema Skin: Skin color, texture, turgor normal. No rashes or lesions Lymph nodes: Cervical, supraclavicular, and axillary nodes normal. Neurologic: Alert and oriented X 3, normal strength and tone. Normal symmetric reflexes. Normal coordination and gait  LABORATORY DATA:  CBC    Component Value Date/Time   WBC 3.7* 02/17/2015 1126   RBC 2.86*  02/17/2015 1126   HGB 9.7* 02/17/2015 1126   HCT 28.1* 02/17/2015 1126   PLT 330 02/17/2015 1126   MCV 98.3 02/17/2015 1126   MCH 33.9 02/17/2015 1126   MCHC 34.5 02/17/2015 1126   RDW 12.8 02/17/2015 1126   LYMPHSABS 1.0 02/12/2015 0633   MONOABS 0.6 02/12/2015 0633   EOSABS 0.2 02/12/2015 0633   BASOSABS 0.0 02/12/2015 0633      Chemistry      Component Value Date/Time   NA 134* 02/17/2015 1126   K 3.8 02/17/2015 1126   CL 97* 02/17/2015 1126   CO2 29 02/17/2015 1126   BUN 25* 02/17/2015 1126   CREATININE 0.92 02/17/2015 1126      Component Value Date/Time   CALCIUM 8.0* 02/17/2015 1126   ALKPHOS 37* 02/12/2015 0633   AST 17 02/12/2015 0633   ALT 17 02/12/2015 0633   BILITOT 0.3 02/12/2015 6045       RADIOGRAPHY: No results found.     PATHOLOGY:    Diagnosis 1. Stomach, resection for tumor, distal stomach SIGNET RING CELL CARCINOMA (4.5 CM, 1 CM IN DEPTH) TUMOR INVADES VISCERAL PERITONEUM LYMPHOVASCULAR INVASION IDENTIFIED MARGINS OF RESECTION ARE NEGATIVE FOR TUMOR METASTATIC CARCINOMA IN TWO OUT OFF FOURTEEN LYMPH NODES (2/14) 2. 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 Negative margins at time of surgery, and 2/14 positive lymph nodes for metastatic disease.  Spent time in discussion with the patient and her sister about gastric cancer. I emphasized the importance of completing staging and we will arrange for CT imaging. We discussed the difference between neo-adjuvant and adjuvant therapy I advised them that current recommendations for resected stage III gastric cancer are concurrent chemotherapy and radiation. We reviewed the NCCN  guidelines. We discussed some of the controversies surrounding treatment of gastric cancer.  I have made several recommendations today including way she could increase her oral intake and improve her weight and performance status.  Oncology history developed.  Labs today: CBC diff, CMET, CEA, CA 19-9  We will make a referral to radiation oncology in [place].  Return in 1 week for follow-up after CT imaging.  All questions were answered. The patient knows to call the clinic with any problems, questions or concerns. We can certainly see the patient much sooner if necessary.  This note is electronically signed by: Molli Hazard, MD  02/27/2015 5:08 PM

## 2015-02-27 NOTE — Progress Notes (Signed)
Heidi Cooper presented for labwork. Labs per MD order drawn via Peripheral Line 23 gauge needle inserted in right forearm (after 2 unsuccessful venipunctures)  Good blood return present. Procedure without incident.  Needle removed intact. Patient tolerated procedure well.

## 2015-02-28 ENCOUNTER — Other Ambulatory Visit (HOSPITAL_COMMUNITY): Payer: Self-pay | Admitting: Oncology

## 2015-02-28 ENCOUNTER — Encounter (HOSPITAL_COMMUNITY): Payer: Self-pay | Admitting: Lab

## 2015-02-28 ENCOUNTER — Other Ambulatory Visit (HOSPITAL_COMMUNITY): Payer: Self-pay

## 2015-02-28 ENCOUNTER — Telehealth (HOSPITAL_COMMUNITY): Payer: Self-pay

## 2015-02-28 DIAGNOSIS — E876 Hypokalemia: Secondary | ICD-10-CM

## 2015-02-28 DIAGNOSIS — D509 Iron deficiency anemia, unspecified: Secondary | ICD-10-CM

## 2015-02-28 LAB — CEA: CEA: 1.7 ng/mL (ref 0.0–4.7)

## 2015-02-28 LAB — CANCER ANTIGEN 19-9: CA 19-9: 21 U/mL (ref 0–35)

## 2015-02-28 MED ORDER — SODIUM CHLORIDE 0.9 % IV SOLN: 125.0000 mg | Freq: Once | INTRAVENOUS | Status: DC

## 2015-02-28 MED ORDER — POTASSIUM CHLORIDE ER 10 MEQ PO TBCR: 10.0000 meq | EXTENDED_RELEASE_TABLET | Freq: Two times a day (BID) | ORAL | Status: DC

## 2015-02-28 MED ORDER — POTASSIUM CHLORIDE ER 10 MEQ PO TBCR
10.0000 meq | EXTENDED_RELEASE_TABLET | Freq: Two times a day (BID) | ORAL | Status: AC
Start: 2015-02-28 — End: ?

## 2015-02-28 NOTE — Telephone Encounter (Signed)
-----   Message from [facility], PA-C sent at 02/28/2015  2:32 PM EDT ----- Kdur 10 mEq BID #28 escribed to [facility] in [place] for low potassium.  I chose 10 mEq because they are smaller pills than the 20 mEq.

## 2015-02-28 NOTE — Progress Notes (Signed)
Referral sent to [facility].  Records faxed on 10/12.  They will call her to set up appt.

## 2015-02-28 NOTE — Telephone Encounter (Signed)
Message left for patient to call office to discuss. 

## 2015-03-01 ENCOUNTER — Telehealth (HOSPITAL_COMMUNITY): Payer: Self-pay | Admitting: Hematology & Oncology

## 2015-03-01 NOTE — Telephone Encounter (Signed)
PC TO [facility] [phone]  ?'[place] [id number]  REQUIRES AUTH. PER CSR VANESSA IT DOES AND I WAS REFERRED TO [facility]- [phone] OBTAINED AUTH'D FOR SCANS  CALLED BACK TO ? IF AUTH IS REQUIRED FOR [id number] PER MRS WALKER IT DOES NOT CALL RSW#[id number]  [facility] [phone]

## 2015-03-02 ENCOUNTER — Other Ambulatory Visit (HOSPITAL_COMMUNITY): Payer: Self-pay | Admitting: *Deleted

## 2015-03-02 ENCOUNTER — Encounter (HOSPITAL_COMMUNITY): Payer: Self-pay | Admitting: *Deleted

## 2015-03-02 DIAGNOSIS — C163 Malignant neoplasm of pyloric antrum: Secondary | ICD-10-CM

## 2015-03-02 MED ORDER — ESCITALOPRAM OXALATE 20 MG PO TABS: ORAL_TABLET | ORAL | Status: AC

## 2015-03-02 NOTE — H&P (Signed)
Heidi Cooper is an 59 y.o. female.   Chief Complaint: Gastric carcinoma HPI: Patient is a 59 year old white female who recently underwent a partial gastrectomy for gastric carcinoma. She now presents for Port-A-Cath insertion in order to undergo chemotherapy.  Past Medical History  Diagnosis Date  . Migraine   . Gastric cancer ([place]) 02/27/2015    Past Surgical History  Procedure Laterality Date  . Tonsillectomy    . Esophagogastroduodenoscopy N/A 02/06/2015    Procedure: ESOPHAGOGASTRODUODENOSCOPY (EGD);  Surgeon: Danie Binder, MD;  [facility];  Service: Endoscopy;  Laterality: N/A;  . Gastrectomy N/A 02/09/2015    Procedure: DISTAL GASTRECTOMY WITH GASTROJEJUNOSTOMY; RIGHT HEMICOLECTOMY;  Surgeon: Aviva Signs, MD;  Location: [facility] ORS;  Service: General;  Laterality: N/A;    Family History  Problem Relation Age of Onset  . Heart disease Mother   . Heart disease Father   . Colon cancer Neg Hx    Social History:  reports that she quit smoking about 3 months ago. She has never used smokeless tobacco. She reports that she does not drink alcohol or use illicit drugs.  Allergies:  Allergies  Allergen Reactions  . Other Itching    Mycins cause itching   . Sulfa Antibiotics Other (See Comments)    Childhood reaction   . Penicillins Rash    Childhood reaction     No prescriptions prior to admission    No results found for this or any previous visit (from the past 48 hour(s)). No results found.  Review of Systems  Constitutional: Positive for malaise/fatigue.  All other systems reviewed and are negative.   There were no vitals taken for this visit. Physical Exam  Constitutional: She is oriented to person, place, and time. She appears well-developed and well-nourished.  HENT:  Head: Normocephalic and atraumatic.  Neck: Normal range of motion. Neck supple.  Cardiovascular: Normal rate, regular rhythm and normal heart sounds.   Respiratory: Effort normal and  breath sounds normal.  GI: Soft. Bowel sounds are normal. There is no tenderness.  Incision healing well.  Neurological: She is alert and oriented to person, place, and time.  Skin: Skin is warm and dry.     Assessment/Plan Impression: Gastric carcinoma Plan: Patient will undergo Port-A-Cath insertion on 03/09/2015. The risks and benefits of the procedure including bleeding, infection, and pneumothorax were fully explained to the patient, who gave informed consent.  Tnia Anglada A 03/02/2015, 7:35 AM

## 2015-03-05 ENCOUNTER — Other Ambulatory Visit (HOSPITAL_COMMUNITY): Payer: Self-pay | Admitting: Hematology & Oncology

## 2015-03-05 ENCOUNTER — Ambulatory Visit (HOSPITAL_COMMUNITY)
Admission: RE | Admit: 2015-03-05 | Discharge: 2015-03-05 | Disposition: A | Discharge status: Home / Self Care | Payer: PRIVATE HEALTH INSURANCE | Source: Ambulatory Visit | Attending: Oncology | Admitting: Oncology

## 2015-03-05 DIAGNOSIS — Z08 Encounter for follow-up examination after completed treatment for malignant neoplasm: Secondary | ICD-10-CM | POA: Diagnosis present

## 2015-03-05 DIAGNOSIS — C775 Secondary and unspecified malignant neoplasm of intrapelvic lymph nodes: Secondary | ICD-10-CM | POA: Insufficient documentation

## 2015-03-05 DIAGNOSIS — C163 Malignant neoplasm of pyloric antrum: Secondary | ICD-10-CM | POA: Insufficient documentation

## 2015-03-05 DIAGNOSIS — Z9049 Acquired absence of other specified parts of digestive tract: Secondary | ICD-10-CM | POA: Diagnosis not present

## 2015-03-05 DIAGNOSIS — Z931 Gastrostomy status: Secondary | ICD-10-CM | POA: Insufficient documentation

## 2015-03-05 DIAGNOSIS — D259 Leiomyoma of uterus, unspecified: Secondary | ICD-10-CM | POA: Insufficient documentation

## 2015-03-05 DIAGNOSIS — K5730 Diverticulosis of large intestine without perforation or abscess without bleeding: Secondary | ICD-10-CM | POA: Diagnosis not present

## 2015-03-05 MED ORDER — LIDOCAINE-PRILOCAINE 2.5-2.5 % EX CREA: TOPICAL_CREAM | CUTANEOUS | Status: AC

## 2015-03-05 MED ORDER — IOHEXOL 300 MG/ML  SOLN
100.0000 mL | Freq: Once | INTRAMUSCULAR | Status: AC | PRN
  Administered 2015-03-05: 100 mL via INTRAVENOUS

## 2015-03-05 NOTE — Patient Instructions (Addendum)
[place]   CHEMOTHERAPY INSTRUCTIONS  Leucovorin - this is a medication that is not chemo but given with chemo. This med "rescues" the healthy cells before we administer the drug 5FU. This makes the 5FU work better. (takes 2 hours to infuse)  5FU: bone marrow suppression (low white blood cells - wbcs fight infection, low red blood cells - rbcs make up your blood, low platelets - this is what makes your blood clot, nausea/vomiting, diarrhea, mouth sores, hair loss, dry skin, ocular toxicities (increased tear production, sensitivity to light). You must wear sunscreen/sunglasses. Cover your skin when out in sunlight. You will get burned very easily.   Prior to Radiation, Cycle I will be --- Leucovorin, 5FU bolus, and wear the pump with 5FU chemo attached for 22 hours total on Days 1 & 15. Then you will start Radiation.   Once you start Radiation, we will only give you 5FU via pump (no Leucovorin or 5FU bolus) and you will wear the pump with the 5FU attached for 5 days in a row (typically Monday-Friday.)   When Radiation is completed, you will receive the chemo like you did with Cycle I. You will do this for 2 total cycles. This will consist of Leucovorin, 5FU bolus, and wear the pump with 5FU chemo attached for 22 hours total on Days 1 & 15 for 2 cycles.  Your chemo cycle consists of 28 days.  We will monitor your labs while receiving chemotherapy.   POTENTIAL SIDE EFFECTS OF TREATMENT: Increased Susceptibility to Infection, Vomiting, Constipation, Hair Thinning, Changes in Character of Skin and Nails (brittleness, dryness,etc.), Bone Marrow Suppression, Nausea, Diarrhea, Sun Sensitivity and Mouth Sores   EDUCATIONAL MATERIALS GIVEN AND REVIEWED: Chemotherapy and You booklet Specific Instructions Sheets: 5FU   SELF CARE ACTIVITIES WHILE ON CHEMOTHERAPY: Increase your fluid intake to 64 ounces of water/decaffeinated beverages daily. No alcohol intake., No  aspirin or other medications unless approved by your oncologist., Eat foods that are light and easy to digest., Eat foods at cold or room temperature., No fried, fatty, or spicy foods immediately before or after treatment., Have teeth cleaned professionally before starting treatment. Keep dentures and partial plates clean., Use soft toothbrush and do not use mouthwashes that contain alcohol. Biotene is a good mouthwash that is available at most pharmacies or may be ordered by calling [phone]., Use warm salt water gargles (1 teaspoon salt per 1 quart warm water) before and after meals and at bedtime. Or you may rinse with 2 tablespoons of three -percent hydrogen peroxide mixed in eight ounces of water., Always use sunscreen with SPF (Sun Protection Factor) of 30 or higher., Use your nausea medication as directed to prevent nausea., Use your stool softener or laxative as directed to prevent constipation. and Use your anti-diarrheal medication as directed to stop diarrhea.   Please wash your hands for at least 30 seconds using warm soapy water. Handwashing is the #1 way to prevent the spread of germs. Stay away from sick people or people who are getting over a cold. If you develop respiratory systems such as green/yellow mucus production or productive cough or persistent cough let us know and we will see if you need an antibiotic. It is a good idea to keep a pair of gloves on when going into grocery stores/[employer] to decrease your risk of coming into contact with germs on the carts, etc. Carry alcohol hand gel with you at all times and use it frequently if  out in public. All foods need to be cooked thoroughly. No raw foods. No medium or undercooked meats, eggs. If your food is cooked medium well, it does not need to be hot pink or saturated with bloody liquid at all. Vegetables and fruits need to be washed/rinsed under the faucet with a dish detergent before being consumed. You can eat raw fruits and  vegetables unless we tell you otherwise but it would be best if you cooked them or bought frozen. Do not eat off of salad bars or hot bars unless you really trust the cleanliness of the restaurant. If you need dental work, please let Dr. Whitney Muse know before you go for your appointment so that we can coordinate the best possible time for you in regards to your chemo regimen. You need to also let your dentist know that you are actively taking chemo. We may need to do labs prior to your dental appointment. We also want your bowels moving at least every other day. If this is not happening, we need to know so that we can get you on a bowel regimen to help you go.      MEDICATIONS: You have been given prescriptions for the following medications:  Zofran 4mg  ODT. Take 1 tablet every 8 hours as needed for nausea/vomiting. (May take 8mg  every 8 hour as needed for nausea/vomiting). #1 nausea med to take   Phenergan 12.5mg  tablet. Take 1 tablet every 6 hours as needed for nausea/vomiting. #2 nausea med to take -- Dr. Whitney Muse may change this.   EMLA cream. Apply a quarter size amount to port site 1 hour prior to chemo. Do not rub in. Cover with plastic wrap.  Over-the-Counter Meds:  Miralax 1 capful in 8 oz of fluid daily. May increase to two times a day if needed. This is a stool softener. If this doesn't work proceed you can add:  Senokot S  - start with 1 tablet two times a day and increase to 4 tablets two times a day if needed. (total of 8 tablets in a 24 hour period). This is a stimulant laxative.   Call us if this does not help your bowels move.   Imodium 2mg  capsule. Take 2 capsules after the 1st loose stool and then 1 capsule every 2 hours until you go a total of 12 hours without having a loose stool. Call the [facility] if loose stools continue.   SYMPTOMS TO REPORT AS SOON AS POSSIBLE AFTER TREATMENT:  FEVER GREATER THAN 100.5 F  CHILLS WITH OR WITHOUT FEVER  NAUSEA AND VOMITING THAT  IS NOT CONTROLLED WITH YOUR NAUSEA MEDICATION  UNUSUAL SHORTNESS OF BREATH  UNUSUAL BRUISING OR BLEEDING  TENDERNESS IN MOUTH AND THROAT WITH OR WITHOUT PRESENCE OF ULCERS  URINARY PROBLEMS  BOWEL PROBLEMS  UNUSUAL RASH    Wear comfortable clothing and clothing appropriate for easy access to any Portacath or PICC line. Let us know if there is anything that we can do to make your therapy better!      I have been informed and understand all of the instructions given to me and have received a copy. I have been instructed to call the clinic [phone] or my family physician as soon as possible for continued medical care, if indicated. I do not have any more questions at this time but understand that I may call the [facility] or the Patient Navigator at [phone] during office hours should I have questions or need assistance in  obtaining follow-up care.            Fluorouracil, 5-FU injection What is this medicine? FLUOROURACIL, 5-FU (flure oh YOOR a sil) is a chemotherapy drug. It slows the growth of cancer cells. This medicine is used to treat many types of cancer like breast cancer, colon or rectal cancer, pancreatic cancer, and stomach cancer. This medicine may be used for other purposes; ask your health care provider or pharmacist if you have questions. What should I tell my health care provider before I take this medicine? They need to know if you have any of these conditions: -blood disorders -dihydropyrimidine dehydrogenase (DPD) deficiency -infection (especially a virus infection such as chickenpox, cold sores, or herpes) -kidney disease -liver disease -malnourished, poor nutrition -recent or ongoing radiation therapy -an unusual or allergic reaction to fluorouracil, other chemotherapy, other medicines, foods, dyes, or preservatives -pregnant or trying to get pregnant -breast-feeding How should I use this medicine? This drug is given as an infusion or  injection into a vein. It is administered in a hospital or clinic by a specially trained health care professional. Talk to your pediatrician regarding the use of this medicine in children. Special care may be needed. Overdosage: If you think you have taken too much of this medicine contact a poison control center or emergency room at once. NOTE: This medicine is only for you. Do not share this medicine with others. What if I miss a dose? It is important not to miss your dose. Call your doctor or health care professional if you are unable to keep an appointment. What may interact with this medicine? -allopurinol -cimetidine -dapsone -digoxin -hydroxyurea -leucovorin -levamisole -medicines for seizures like ethotoin, fosphenytoin, phenytoin -medicines to increase blood counts like filgrastim, pegfilgrastim, sargramostim -medicines that treat or prevent blood clots like warfarin, enoxaparin, and dalteparin -methotrexate -metronidazole -pyrimethamine -some other chemotherapy drugs like busulfan, cisplatin, estramustine, vinblastine -trimethoprim -trimetrexate -vaccines Talk to your doctor or health care professional before taking any of these medicines: -acetaminophen -aspirin -ibuprofen -ketoprofen -naproxen This list may not describe all possible interactions. Give your health care provider a list of all the medicines, herbs, non-prescription drugs, or dietary supplements you use. Also tell them if you smoke, drink alcohol, or use illegal drugs. Some items may interact with your medicine. What should I watch for while using this medicine? Visit your doctor for checks on your progress. This drug may make you feel generally unwell. This is not uncommon, as chemotherapy can affect healthy cells as well as cancer cells. Report any side effects. Continue your course of treatment even though you feel ill unless your doctor tells you to stop. In some cases, you may be given additional  medicines to help with side effects. Follow all directions for their use. Call your doctor or health care professional for advice if you get a fever, chills or sore throat, or other symptoms of a cold or flu. Do not treat yourself. This drug decreases your body's ability to fight infections. Try to avoid being around people who are sick. This medicine may increase your risk to bruise or bleed. Call your doctor or health care professional if you notice any unusual bleeding. Be careful brushing and flossing your teeth or using a toothpick because you may get an infection or bleed more easily. If you have any dental work done, tell your dentist you are receiving this medicine. Avoid taking products that contain aspirin, acetaminophen, ibuprofen, naproxen, or ketoprofen unless instructed by your doctor. These medicines  may hide a fever. Do not become pregnant while taking this medicine. Women should inform their doctor if they wish to become pregnant or think they might be pregnant. There is a potential for serious side effects to an unborn child. Talk to your health care professional or pharmacist for more information. Do not breast-feed an infant while taking this medicine. Men should inform their doctor if they wish to father a child. This medicine may lower sperm counts. Do not treat diarrhea with over the counter products. Contact your doctor if you have diarrhea that lasts more than 2 days or if it is severe and watery. This medicine can make you more sensitive to the sun. Keep out of the sun. If you cannot avoid being in the sun, wear protective clothing and use sunscreen. Do not use sun lamps or tanning beds/booths. What side effects may I notice from receiving this medicine? Side effects that you should report to your doctor or health care professional as soon as possible: -allergic reactions like skin rash, itching or hives, swelling of the face, lips, or tongue -low blood counts - this medicine  may decrease the number of white blood cells, red blood cells and platelets. You may be at increased risk for infections and bleeding. -signs of infection - fever or chills, cough, sore throat, pain or difficulty passing urine -signs of decreased platelets or bleeding - bruising, pinpoint red spots on the skin, black, tarry stools, blood in the urine -signs of decreased red blood cells - unusually weak or tired, fainting spells, lightheadedness -breathing problems -changes in vision -chest pain -mouth sores -nausea and vomiting -pain, swelling, redness at site where injected -pain, tingling, numbness in the hands or feet -redness, swelling, or sores on hands or feet -stomach pain -unusual bleeding Side effects that usually do not require medical attention (report to your doctor or health care professional if they continue or are bothersome): -changes in finger or toe nails -diarrhea -dry or itchy skin -hair loss -headache -loss of appetite -sensitivity of eyes to the light -stomach upset -unusually teary eyes This list may not describe all possible side effects. Call your doctor for medical advice about side effects. You may report side effects to FDA at 1-800-FDA-1088. Where should I keep my medicine? This drug is given in a hospital or clinic and will not be stored at home. NOTE: This sheet is a summary. It may not cover all possible information. If you have questions about this medicine, talk to your doctor, pharmacist, or health care provider.    2016, Elsevier/[employer] Standard. (2007-09-08 13:53:16) Ondansetron oral dissolving tablet What is this medicine? ONDANSETRON (on DAN se tron) is used to treat nausea and vomiting caused by chemotherapy. It is also used to prevent or treat nausea and vomiting after surgery. This medicine may be used for other purposes; ask your health care provider or pharmacist if you have questions. What should I tell my health care provider before I  take this medicine? They need to know if you have any of these conditions: -heart disease -history of irregular heartbeat -liver disease -low levels of magnesium or potassium in the blood -an unusual or allergic reaction to ondansetron, granisetron, other medicines, foods, dyes, or preservatives -pregnant or trying to get pregnant -breast-feeding How should I use this medicine? These tablets are made to dissolve in the mouth. Do not try to push the tablet through the foil backing. With dry hands, peel away the foil backing and gently remove the tablet.  Place the tablet in the mouth and allow it to dissolve, then swallow. While you may take these tablets with water, it is not necessary to do so. Talk to your pediatrician regarding the use of this medicine in children. Special care may be needed. Overdosage: If you think you have taken too much of this medicine contact a poison control center or emergency room at once. NOTE: This medicine is only for you. Do not share this medicine with others. What if I miss a dose? If you miss a dose, take it as soon as you can. If it is almost time for your next dose, take only that dose. Do not take double or extra doses. What may interact with this medicine? Do not take this medicine with any of the following medications: -apomorphine -certain medicines for fungal infections like fluconazole, itraconazole, ketoconazole, posaconazole, voriconazole -cisapride -dofetilide -dronedarone -pimozide -thioridazine -ziprasidone This medicine may also interact with the following medications: -carbamazepine -certain medicines for depression, anxiety, or psychotic disturbances -fentanyl -linezolid -MAOIs like Carbex, Eldepryl, Marplan, Nardil, and Parnate -methylene blue (injected into a vein) -other medicines that prolong the QT interval (cause an abnormal heart rhythm) -phenytoin -rifampicin -tramadol This list may not describe all possible interactions.  Give your health care provider a list of all the medicines, herbs, non-prescription drugs, or dietary supplements you use. Also tell them if you smoke, drink alcohol, or use illegal drugs. Some items may interact with your medicine. What should I watch for while using this medicine? Check with your doctor or health care professional as soon as you can if you have any sign of an allergic reaction. What side effects may I notice from receiving this medicine? Side effects that you should report to your doctor or health care professional as soon as possible: -allergic reactions like skin rash, itching or hives, swelling of the face, lips, or tongue -breathing problems -confusion -dizziness -fast or irregular heartbeat -feeling faint or lightheaded, falls -fever and chills -loss of balance or coordination -seizures -sweating -swelling of the hands and feet -tightness in the chest -tremors -unusually weak or tired Side effects that usually do not require medical attention (report to your doctor or health care professional if they continue or are bothersome): -constipation or diarrhea -headache This list may not describe all possible side effects. Call your doctor for medical advice about side effects. You may report side effects to FDA at 1-800-FDA-1088. Where should I keep my medicine? Keep out of the reach of children. Store between 2 and 30 degrees C (36 and 86 degrees F). Throw away any unused medicine after the expiration date. NOTE: This sheet is a summary. It may not cover all possible information. If you have questions about this medicine, talk to your doctor, pharmacist, or health care provider.    2016, [employer] Standard. (2013-02-09 16:21:52) Promethazine tablets What is this medicine? PROMETHAZINE (proe METH a zeen) is an antihistamine. It is used to treat allergic reactions and to treat or prevent nausea and vomiting from illness or motion sickness. It is also used to make  you sleep before surgery, and to help treat pain or nausea after surgery. This medicine may be used for other purposes; ask your health care provider or pharmacist if you have questions. What should I tell my health care provider before I take this medicine? They need to know if you have any of these conditions: -glaucoma -high blood pressure or heart disease -kidney disease -liver disease -lung or breathing disease, like asthma -  prostate trouble -pain or difficulty passing urine -seizures -an unusual or allergic reaction to promethazine or phenothiazines, other medicines, foods, dyes, or preservatives -pregnant or trying to get pregnant -breast-feeding How should I use this medicine? Take this medicine by mouth with a glass of water. Follow the directions on the prescription label. Take your doses at regular intervals. Do not take your medicine more often than directed. Talk to your pediatrician regarding the use of this medicine in children. Special care may be needed. This medicine should not be given to infants and children younger than 44 years old. Overdosage: If you think you have taken too much of this medicine contact a poison control center or emergency room at once. NOTE: This medicine is only for you. Do not share this medicine with others. What if I miss a dose? If you miss a dose, take it as soon as you can. If it is almost time for your next dose, take only that dose. Do not take double or extra doses. What may interact with this medicine? Do not take this medicine with any of the following medications: -cisapride -dofetilide -dronedarone -MAOIs like Carbex, Eldepryl, Marplan, Nardil, Parnate -pimozide -quinidine, including dextromethorphan; quinidine -thioridazine -ziprasidone This medicine may also interact with the following medications: -certain medicines for depression, anxiety, or psychotic disturbances -certain medicines for anxiety or sleep -certain medicines  for seizures like carbamazepine, phenobarbital, phenytoin -certain medicines for movement abnormalities as in Parkinson's disease, or for gastrointestinal problems -epinephrine -medicines for allergies or colds -muscle relaxants -narcotic medicines for pain -other medicines that prolong the QT interval (cause an abnormal heart rhythm) -tramadol -trimethobenzamide This list may not describe all possible interactions. Give your health care provider a list of all the medicines, herbs, non-prescription drugs, or dietary supplements you use. Also tell them if you smoke, drink alcohol, or use illegal drugs. Some items may interact with your medicine. What should I watch for while using this medicine? Tell your doctor or health care professional if your symptoms do not start to get better in 1 to 2 days. You may get drowsy or dizzy. Do not drive, use machinery, or do anything that needs mental alertness until you know how this medicine affects you. To reduce the risk of dizzy or fainting spells, do not stand or sit up quickly, especially if you are an older patient. Alcohol may increase dizziness and drowsiness. Avoid alcoholic drinks. Your mouth may get dry. Chewing sugarless gum or sucking hard candy, and drinking plenty of water may help. Contact your doctor if the problem does not go away or is severe. This medicine may cause dry eyes and blurred vision. If you wear contact lenses you may feel some discomfort. Lubricating drops may help. See your eye doctor if the problem does not go away or is severe. This medicine can make you more sensitive to the sun. Keep out of the sun. If you cannot avoid being in the sun, wear protective clothing and use sunscreen. Do not use sun lamps or tanning beds/booths. If you are diabetic, check your blood-sugar levels regularly. What side effects may I notice from receiving this medicine? Side effects that you should report to your doctor or health care professional as  soon as possible: -blurred vision -irregular heartbeat, palpitations or chest pain -muscle or facial twitches -pain or difficulty passing urine -seizures -skin rash -slowed or shallow breathing -unusual bleeding or bruising -yellowing of the eyes or skin Side effects that usually do not require medical attention (report  to your doctor or health care professional if they continue or are bothersome): -headache -nightmares, agitation, nervousness, excitability, not able to sleep (these are more likely in children) -stuffy nose This list may not describe all possible side effects. Call your doctor for medical advice about side effects. You may report side effects to FDA at 1-800-FDA-1088. Where should I keep my medicine? Keep out of the reach of children. Store at room temperature, between 20 and 25 degrees C (68 and 77 degrees F). Protect from light. Throw away any unused medicine after the expiration date. NOTE: This sheet is a summary. It may not cover all possible information. If you have questions about this medicine, talk to your doctor, pharmacist, or health care provider.    2016, [employer] Standard. (2013-01-04 15:04:46) Loperamide tablets or capsules What is this medicine? LOPERAMIDE (loe PER a mide) is used to treat diarrhea. This medicine may be used for other purposes; ask your health care provider or pharmacist if you have questions. What should I tell my health care provider before I take this medicine? They need to know if you have any of these conditions: -a black or bloody stool -bacterial food poisoning -colitis or mucus in your stool -currently taking an antibiotic medication for an infection -fever -liver disease -severe abdominal pain, swelling or bulging -an unusual or allergic reaction to loperamide, other medicines, foods, dyes, or preservatives -pregnant or trying to get pregnant -breast-feeding How should I use this medicine? Take this medicine by  mouth with a glass of water. Follow the directions on the prescription label. Take your doses at regular intervals. Do not take your medicine more often than directed. Talk to your pediatrician regarding the use of this medicine in children. Special care may be needed. Overdosage: If you think you have taken too much of this medicine contact a poison control center or emergency room at once. NOTE: This medicine is only for you. Do not share this medicine with others. What if I miss a dose? This does not apply. This medicine is not for regular use. Only take this medicine while you continue to have loose bowel movements. Do not take more medicine than recommended by the packaging label or by your healthcare professional. What may interact with this medicine? Do not take this medicine with any of the following medications: - alosetron This medicine may also interact with the following medications: -cimetidine -clarithromycin -erythromycin -gemfibrozil -itraconazole -ketoconazole -quinidine -quinine -ranitidine -ritonavir -saquinavir This list may not describe all possible interactions. Give your health care provider a list of all the medicines, herbs, non-prescription drugs, or dietary supplements you use. Also tell them if you smoke, drink alcohol, or use illegal drugs. Some items may interact with your medicine. What should I watch for while using this medicine? Do not take this medicine for more than 2 days without asking your doctor or health care professional. Do not use doses higher than those prescribed by your doctor or listed on the label. Check with your doctor or health care professional right away if you develop a fever, severe abdominal pain, swelling or bulging, or if you have have bloody/black diarrhea or stools. You may get drowsy or dizzy. Do not drive, use machinery, or do anything that needs mental alertness until you know how this medicine affects you. Do not stand or sit  up quickly, especially if you are an older patient. This reduces the risk of dizzy or fainting spells. Alcohol can increase possible drowsiness and dizziness. Avoid alcoholic  drinks. Your mouth may get dry. Chewing sugarless gum or sucking hard candy, and drinking plenty of water may help. Contact your doctor if the problem does not go away or is severe. Drinking plenty of water can also help prevent dehydration that can occur with diarrhea. Elderly patients may have a more variable response to the effects of this medicine, and are more susceptible to the effects of dehydration. What side effects may I notice from receiving this medicine? Side effects that you should report to your doctor or health care professional as soon as possible: - allergic reactions like skin rash, itching or hives, swelling of the face, lips, or tongue -bloated, swollen feeling in your abdomen -blurred vision -loss of appetite -signs and symptoms of a dangerous change in heartbeat or heart rhythm like chest pain; dizziness; fast or irregular heartbeat palpitations; feeling faint or lightheaded, falls; breathing problems -stomach pain Side effects that usually do not require medical attention (report to your doctor or health care professional if they continue or are bothersome): - constipation -drowsiness or dizziness -dry mouth -nausea, vomiting This list may not describe all possible side effects. Call your doctor for medical advice about side effects. You may report side effects to FDA at 1-800-FDA-1088. Where should I keep my medicine? Keep out of the reach of children. Store at room temperature between 15 and 25 degrees C (59 and 77 degrees F). Keep container tightly closed. Throw away any unused medicine after the expiration date. NOTE: This sheet is a summary. It may not cover all possible information. If you have questions about this medicine, talk to your doctor, pharmacist, or health care provider.    2016,  Elsevier/Gold Standard. (2014-10-31 15:29:29) Leucovorin injection What is this medicine? LEUCOVORIN (loo koe VOR in) is used to prevent or treat the harmful effects of some medicines. This medicine is used to treat anemia caused by a low amount of folic acid in the body. It is also used with 5-fluorouracil (5-FU) to treat colon cancer. This medicine may be used for other purposes; ask your health care provider or pharmacist if you have questions. What should I tell my health care provider before I take this medicine? They need to know if you have any of these conditions: -anemia from low levels of vitamin B-12 in the blood -an unusual or allergic reaction to leucovorin, folic acid, other medicines, foods, dyes, or preservatives -pregnant or trying to get pregnant -breast-feeding How should I use this medicine? This medicine is for injection into a muscle or into a vein. It is given by a health care professional in a hospital or clinic setting. Talk to your pediatrician regarding the use of this medicine in children. Special care may be needed. Overdosage: If you think you have taken too much of this medicine contact a poison control center or emergency room at once. NOTE: This medicine is only for you. Do not share this medicine with others. What if I miss a dose? This does not apply. What may interact with this medicine? -capecitabine -fluorouracil -phenobarbital -phenytoin -primidone -trimethoprim-sulfamethoxazole This list may not describe all possible interactions. Give your health care provider a list of all the medicines, herbs, non-prescription drugs, or dietary supplements you use. Also tell them if you smoke, drink alcohol, or use illegal drugs. Some items may interact with your medicine. What should I watch for while using this medicine? Your condition will be monitored carefully while you are receiving this medicine. This medicine may increase the side effects  of  5-fluorouracil, 5-FU. Tell your doctor or health care professional if you have diarrhea or mouth sores that do not get better or that get worse. What side effects may I notice from receiving this medicine? Side effects that you should report to your doctor or health care professional as soon as possible: -allergic reactions like skin rash, itching or hives, swelling of the face, lips, or tongue -breathing problems -fever, infection -mouth sores -unusual bleeding or bruising -unusually weak or tired Side effects that usually do not require medical attention (report to your doctor or health care professional if they continue or are bothersome): -constipation or diarrhea -loss of appetite -nausea, vomiting This list may not describe all possible side effects. Call your doctor for medical advice about side effects. You may report side effects to FDA at 1-800-FDA-1088. Where should I keep my medicine? This drug is given in a hospital or clinic and will not be stored at home. NOTE: This sheet is a summary. It may not cover all possible information. If you have questions about this medicine, talk to your doctor, pharmacist, or health care provider.    2016, Elsevier/Gold Standard. (2007-11-09 16:50:29)

## 2015-03-06 ENCOUNTER — Encounter (HOSPITAL_COMMUNITY)
Admission: RE | Admit: 2015-03-06 | Discharge: 2015-03-06 | Disposition: A | Discharge status: Home / Self Care | Payer: PRIVATE HEALTH INSURANCE | Source: Ambulatory Visit | Attending: General Surgery | Admitting: General Surgery

## 2015-03-06 ENCOUNTER — Other Ambulatory Visit (HOSPITAL_COMMUNITY): Payer: Self-pay | Admitting: Oncology

## 2015-03-06 NOTE — Pre-Procedure Instructions (Signed)
Attempt #1 made to contact patient regarding pre op assessment.  Unable to leave message at this time.

## 2015-03-08 ENCOUNTER — Encounter (HOSPITAL_BASED_OUTPATIENT_CLINIC_OR_DEPARTMENT_OTHER): Payer: PRIVATE HEALTH INSURANCE | Admitting: Hematology & Oncology

## 2015-03-08 ENCOUNTER — Encounter (HOSPITAL_COMMUNITY): Payer: Self-pay | Admitting: Hematology & Oncology

## 2015-03-08 ENCOUNTER — Encounter (HOSPITAL_BASED_OUTPATIENT_CLINIC_OR_DEPARTMENT_OTHER): Payer: PRIVATE HEALTH INSURANCE

## 2015-03-08 ENCOUNTER — Telehealth: Payer: Self-pay | Admitting: *Deleted

## 2015-03-08 ENCOUNTER — Encounter: Payer: Self-pay | Admitting: Dietician

## 2015-03-08 ENCOUNTER — Ambulatory Visit: Payer: Self-pay | Admitting: Nurse Practitioner

## 2015-03-08 ENCOUNTER — Encounter (HOSPITAL_COMMUNITY): Payer: PRIVATE HEALTH INSURANCE

## 2015-03-08 VITALS — BP 99/55 | HR 66 | Temp 97.6°F | Resp 15 | Wt 97.0 lb

## 2015-03-08 DIAGNOSIS — C163 Malignant neoplasm of pyloric antrum: Secondary | ICD-10-CM

## 2015-03-08 DIAGNOSIS — C779 Secondary and unspecified malignant neoplasm of lymph node, unspecified: Secondary | ICD-10-CM

## 2015-03-08 DIAGNOSIS — C169 Malignant neoplasm of stomach, unspecified: Secondary | ICD-10-CM | POA: Diagnosis not present

## 2015-03-08 DIAGNOSIS — R634 Abnormal weight loss: Secondary | ICD-10-CM | POA: Diagnosis not present

## 2015-03-08 DIAGNOSIS — D509 Iron deficiency anemia, unspecified: Secondary | ICD-10-CM

## 2015-03-08 MED ORDER — SODIUM CHLORIDE 0.9 % IV SOLN
125.0000 mg | Freq: Once | INTRAVENOUS | Status: AC
  Administered 2015-03-08: 125 mg via INTRAVENOUS
  Filled 2015-03-08: qty 10

## 2015-03-08 MED ORDER — SODIUM CHLORIDE 0.9 % IV SOLN
INTRAVENOUS | Status: DC
  Administered 2015-03-08: 10:00:00 via INTRAVENOUS

## 2015-03-08 NOTE — Progress Notes (Signed)
Following up with patient as she is here for treatment planning.   Last seen 9/30 after partial gastrectomy w/ Roux en Y Gastrojejunostomy and right hemicolectomy for adenocarcinoma.   Contacted Pt by visiting during her appointment  Wt Readings from Last 10 Encounters:  03/08/15 97 lb (43.999 kg)  02/27/15 98 lb 3.2 oz (44.543 kg)  02/06/15 103 lb 4.8 oz (46.857 kg)  01/23/15 111 lb (50.349 kg)  01/21/15 108 lb (48.988 kg)  01/15/15 108 lb 9.6 oz (49.261 kg)   Patient weight has decreased by another 6 lbs since her hospital admission. Some wt loss was expected.  Patient reports oral intake as fair and is suffering from a lack of appetite.   Pt is eating 3 smaller meals. One meal mentioned was half of a burger. She has not had any n/v/c/d. She has not started taking the vitamin/mineral supplements yet as she wanted to check with MD.    Daughter states the pt has had trouble finding a protein supplement that she likes. She does not like any of the Ensure/Boost supplements that she has tried. She has been eating carnation instant breakfast and greek yogurt. The greek yogurt is a good choice. The carnation instant breakfast is a little low in protein.   Encouraged to keep trying different supplements and she would greatly benefit from one to help maintain weight/muscle. Recommend she try Premier Protein or a protein bar if she does not like the drinks at all.   Reiterated she has much higher pro/cal needs then a typical adult. Daughter asked for a specific amount. Estimated 60-80 g/day.   Discussed with pt and daughter what my role would be as she starts treatment and to call RD if she starts to experience severe side effects or further wt loss.   Pt and daughter both were very quiet during discussion. Daughter seemed to be slightly frustrated with pt.   Left my contact info, coupons and handouts titled "Soft and Moist High Protein Menu Ideas" and "Increasing Calories and  Protein"   Burtis Junes RD, LDN Nutrition Pager: [phone] 03/08/2015 11:18 AM

## 2015-03-08 NOTE — Progress Notes (Signed)
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 Negative margins at time of surgery, and 2/14 positive lymph nodes for metastatic disease.  HISTORY OF PRESENT ILLNESS:   [name] is a 59 year old white female with a past medical history significant for GERD who is referred to [facility] following diagnosis of signet ring cell carcinoma of gastric antrum, clinically Stage IIIA measuring 4.5 cm (1 cm in depth) with invasion into the visceral peritoneum, LVI+. Negative margins at time of surgery, and 2/14 positive lymph nodes for metastatic disease.  The patient has been trying to work on her nutrition with suggestions from her sister  Her sister has suggested that she drinks the "proper" amount of water daily along with an adequate protein intake.  The patient notes that due to her increased intake of water, she does not have an appetite for food.  Her sister is concerned with the patient going through treatment and radiation alone as she cannot be there for her as consistent as she would like; she resides two hours away. The patient notes that she gets constipated every other day.  This is alleviated with suppositories.  Denies vomiting.  She reports that she has not been sleeping since she began taking the anti-depressant.  She will have chemotherapy teaching today.    The patient feels she can manage ok with the help of staff here and her sister's intermittent help.  She would like to decrease her water intake some since it "fills her up"  She notes that she hasn't been able to eat in months, and she is now finally able to eat some without nausea/vomiting or  abdominal pain.  She states that it alone is a huge improvement.    Gastric cancer ([place])   01/21/2015 Imaging CT abd/pelvis- Gastroptosis with massive enlargement of the stomach, but no acute bowel obstruction. Query severe gastroparesis. See coronal image 33. No other acute or inflammatory process identified in the abdomen or pelvis. Diverticulosis   02/06/2015 - 02/19/2015 Hospital Admission Nausea with vomiting, Gastric outlet obstruction, Malnutrition of moderate degree ([place])   02/06/2015 Procedure EGD by Dr. Oneida Alar   02/06/2015 Pathology Results Stomach, biopsy, pre-pylori - ULCERATED, POORLY DIFFERENTIATED ADENOCARCINOMA   02/09/2015 Definitive Surgery Dr. Arnoldo Morale- partial gastrectomy (distal antrectomy) with Roux-en-Y gastrojejunostomy, right hemicolectomy.   02/09/2015 Pathology Results Stomach, resection for tumor, distal stomach SIGNET RING CELL CARCINOMA (4.5 CM, 1 CM IN DEPTH) TUMOR INVADES VISCERAL PERITONEUM LYMPHOVASCULAR INVASION IDENTIFIED,MARGINS OF RESECTION ARE NEGATIVE FOR TUMOR METASTATIC CARCINOMA IN 2/14   02/09/2015 Pathology Results HER NEGATIVE   02/12/2015 Imaging DG UGI- Patent gastrojejunostomy anastomosis post distal gastrectomy. No evidence of contrast extravasation to suggest anastomotic leak.     PAST MEDICAL HISTORY:   Past Medical History  Diagnosis Date  . Migraine   . Gastric cancer ([place]) 02/27/2015    ALLERGIES: Allergies  Allergen Reactions  . Other Itching    Mycins cause itching   . Sulfa Antibiotics Other (See Comments)    Childhood reaction   . Penicillins Rash    Childhood reaction       MEDICATIONS: I  have reviewed the patient's current medications.    Current Outpatient Prescriptions on File Prior to Visit  Medication Sig Dispense Refill  . escitalopram (LEXAPRO) 20 MG tablet Take 1/2 tab for 5 days then increase to 1 tab daily. 30 tablet 3  . pantoprazole (PROTONIX) 40 MG tablet Take 40 mg by mouth at bedtime.     . potassium chloride  (K-DUR) 10 MEQ tablet Take 1 tablet (10 mEq total) by mouth 2 (two) times daily. 28 tablet 0  . topiramate (TOPAMAX) 25 MG tablet Take 25 mg by mouth at bedtime.     . fluticasone (FLONASE) 50 MCG/ACT nasal spray Place 2 sprays into the nose daily as needed for allergies.     Marland Kitchen HYDROcodone-acetaminophen (NORCO/VICODIN) 5-325 MG tablet Take 1-2 tablets by mouth every 4 (four) hours as needed for moderate pain. (Patient not taking: Reported on 03/08/2015) 50 tablet 0  . metoCLOPramide (REGLAN) 5 MG/5ML solution Take 10 mLs (10 mg total) by mouth 3 (three) times daily before meals. (Patient not taking: Reported on 02/27/2015) 120 mL 0  . ondansetron (ZOFRAN ODT) 4 MG disintegrating tablet Take 1 tablet (4 mg total) by mouth every 8 (eight) hours as needed for nausea. (Patient not taking: Reported on 03/08/2015) 30 tablet 1  . promethazine (PHENERGAN) 12.5 MG tablet Take 1 tablet (12.5 mg total) by mouth every 6 (six) hours as needed for nausea or vomiting. (Patient not taking: Reported on 02/27/2015) 20 tablet 0   No current facility-administered medications on file prior to visit.     PAST SURGICAL HISTORY Past Surgical History  Procedure Laterality Date  . Tonsillectomy    . Esophagogastroduodenoscopy N/A 02/06/2015    Procedure: ESOPHAGOGASTRODUODENOSCOPY (EGD);  Surgeon: Danie Binder, MD;  Location[facility];  Service: Endoscopy;  Laterality: N/A;  . Gastrectomy N/A 02/09/2015    Procedure: DISTAL GASTRECTOMY WITH GASTROJEJUNOSTOMY; RIGHT HEMICOLECTOMY;  Surgeon: Aviva Signs, MD;  Location: [facility] ORS;  Service: General;  Laterality: N/A;    FAMILY HISTORY: Family History  Problem Relation Age of Onset  . Heart disease Mother   . Heart disease Father   . Colon cancer Neg Hx    She has never been married.  She has no children.  She has two younger sisters ages 71 and 60.  Both are healthy.  One lives in [place] and another is in [place].  Her father passed at a young age of 77  secondary to a MI.  Her mother is deceased at the age of 32 from a MI as well.  She denies any family history of malignancies.  SOCIAL HISTORY:  reports that she quit smoking about 3 months ago. She has never used smokeless tobacco. She reports that she does not drink alcohol or use illicit drugs.  She notes a history of tobacco abuse, smoking 1 ppd x 45 years.  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 She admits to a distant history of marijuana use.  PERFORMANCE STATUS: The patient's performance status is 1 - Symptomatic but completely ambulatory  PHYSICAL EXAM: Most Recent Vital Signs: Blood pressure 99/55, pulse 66, temperature 97.6 F (36.4 C), temperature source Oral, resp. rate 15, weight 97 lb (43.999 kg), SpO2 100 %. General appearance: alert, cooperative, appears stated age, cachectic, no distress, pale and accomapnied by her sister Head: Normocephalic, without obvious abnormality, atraumatic Eyes: negative findings: lids and lashes normal, conjunctivae and sclerae normal and corneas clear Neck: no adenopathy  and supple, symmetrical, trachea midline Lungs: clear to auscultation bilaterally and normal percussion bilaterally Heart: regular rate and rhythm, S1, S2 normal, no murmur, click, rub or gallop Abdomen: soft, non-tender; bowel sounds normal; no masses,  no organomegaly and surgical scar is well healed without any erythema. Extremities: extremities normal, atraumatic, no cyanosis or edema Skin: Skin color, texture, turgor normal. No rashes or lesions Lymph nodes: Cervical, supraclavicular, and axillary nodes normal. Neurologic: Alert and oriented X 3, normal strength and tone. Normal symmetric reflexes. Normal coordination and gait  LABORATORY DATA:  I have reviewed the data below as listed. CBC    Component Value Date/Time   WBC 5.6 02/27/2015 1740   RBC 2.96* 02/27/2015 1740   HGB 9.8* 02/27/2015 1740   HCT 29.3* 02/27/2015 1740    PLT 411* 02/27/2015 1740   MCV 99.0 02/27/2015 1740   MCH 33.1 02/27/2015 1740   MCHC 33.4 02/27/2015 1740   RDW 14.1 02/27/2015 1740   LYMPHSABS 2.0 02/27/2015 1740   MONOABS 0.4 02/27/2015 1740   EOSABS 0.1 02/27/2015 1740   BASOSABS 0.0 02/27/2015 1740      Chemistry      Component Value Date/Time   NA 138 02/27/2015 1740   K 3.2* 02/27/2015 1740   CL 109 02/27/2015 1740   CO2 23 02/27/2015 1740   BUN 15 02/27/2015 1740   CREATININE 0.78 02/27/2015 1740      Component Value Date/Time   CALCIUM 8.6* 02/27/2015 1740   ALKPHOS 59 02/27/2015 1740   AST 16 02/27/2015 1740   ALT 16 02/27/2015 1740   BILITOT 0.3 02/27/2015 1740       RADIOGRAPHY: CLINICAL DATA: Staging for gastric antral pyloric cancer (signet ring cell carcinoma of the antrum with invasion of the visceral peri a.m.). Two out of 14 lymph nodes were positive for metastatic disease. Surgery in September 2016.  EXAM: CT CHEST, ABDOMEN, AND PELVIS WITH CONTRAST  IMPRESSION: 1. Postoperative for gastrojejunostomy and right hemicolectomy. No specific findings of residual or recurrent tumor, although there is some stranding in the upper omentum which may be postoperative but which warrants observation. 2. Borderline gallbladder wall thickening. 3. Calcified uterine fibroids. 4. Levoconvex lumbar rotary scoliosis. 5. Sigmoid diverticulosis.   Electronically Signed  By: Van Clines M.D.  On: 03/05/2015 14:36    PATHOLOGY:    Diagnosis 1. Stomach, resection for tumor, distal stomach SIGNET RING CELL CARCINOMA (4.5 CM, 1 CM IN DEPTH) TUMOR INVADES VISCERAL PERITONEUM LYMPHOVASCULAR INVASION IDENTIFIED MARGINS OF RESECTION ARE NEGATIVE FOR TUMOR METASTATIC CARCINOMA IN TWO OUT OFF FOURTEEN LYMPH NODES (2/14) 2. 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 Negative margins at time of surgery, and 2/14 positive lymph nodes for metastatic disease.  We reviewed her imaging in detail.  There is no obvious evidence of additional disease.  We discussed the difference between neo-adjuvant and adjuvant therapy I advised them that current recommendations for resected stage III gastric cancer are concurrent chemotherapy and radiation. We reviewed the NCCN guidelines. We discussed some of the controversies surrounding treatment of gastric cancer.  I have made several recommendations today including way  she could increase her oral intake and improve her weight and performance status. We will have her work with nutrition moving forward.  She was encouraged to eat small very frequent meals.   We will make a referral to radiation oncology in [place].  Return in 2 weeks for follow-up and after starting recommended therapy.   All questions were answered. The patient knows to call the clinic with any problems, questions or concerns. We can certainly see the patient much sooner if necessary.   This document serves as a record of services personally performed by Ancil Linsey, MD. It was created on her behalf by Janace Hoard, a trained medical scribe. The creation of this record is based on the scribe's personal observations and the provider's statements to them. This document has been checked and approved by the attending provider.  I have reviewed the above documentation for accuracy and completeness, and I agree with the above.  This note is electronically signed.  Kelby Fam. Whitney Muse, MD

## 2015-03-08 NOTE — Telephone Encounter (Signed)
Cancelled appointment with Dr. Lisbeth Renshaw as patient will be seen in [facility]

## 2015-03-08 NOTE — Patient Instructions (Signed)
[place] at [facility] Discharge Instructions  RECOMMENDATIONS MADE BY THE CONSULTANT AND ANY TEST RESULTS WILL BE SENT TO YOUR REFERRING PHYSICIAN.  Exam and discussion by Dr.Penland. Chemotherapy Teaching to be done today by Lupita Raider. Iron infusion today. Appointments to be arranged. Will see you back when you start chemotherapy.   Thank you for choosing [place] at [facility] to provide your oncology and hematology care.  To afford each patient quality time with our provider, please arrive at least 15 minutes before your scheduled appointment time.    You need to re-schedule your appointment should you arrive 10 or more minutes late.  We strive to give you quality time with our providers, and arriving late affects you and other patients whose appointments are after yours.  Also, if you no show three or more times for appointments you may be dismissed from the clinic at the providers discretion.     Again, thank you for choosing [facility].  Our hope is that these requests will decrease the amount of time that you wait before being seen by our physicians.       _____________________________________________________________  Should you have questions after your visit to [facility], please contact our office at [phone] between the hours of 8:30 a.m. and 4:30 p.m.  Voicemails left after 4:30 p.m. will not be returned until the following business day.  For prescription refill requests, have your pharmacy contact our office.

## 2015-03-08 NOTE — Progress Notes (Signed)
Chemo teaching done and consent signed for 5FU & Leucovorin. Calendar to be made and emailed to sister and another copy mailed to patient. [employer] paperwork filled out and will be faxed today.

## 2015-03-08 NOTE — Patient Instructions (Signed)
[place] at [facility] Discharge Instructions  RECOMMENDATIONS MADE BY THE CONSULTANT AND ANY TEST RESULTS WILL BE SENT TO YOUR REFERRING PHYSICIAN.  IV Iron today.    Thank you for choosing [place] at [facility] to provide your oncology and hematology care.  To afford each patient quality time with our provider, please arrive at least 15 minutes before your scheduled appointment time.    You need to re-schedule your appointment should you arrive 10 or more minutes late.  We strive to give you quality time with our providers, and arriving late affects you and other patients whose appointments are after yours.  Also, if you no show three or more times for appointments you may be dismissed from the clinic at the providers discretion.     Again, thank you for choosing [facility].  Our hope is that these requests will decrease the amount of time that you wait before being seen by our physicians.       _____________________________________________________________  Should you have questions after your visit to [facility], please contact our office at [phone] between the hours of 8:30 a.m. and 4:30 p.m.  Voicemails left after 4:30 p.m. will not be returned until the following business day.  For prescription refill requests, have your pharmacy contact our office.

## 2015-03-09 ENCOUNTER — Ambulatory Visit (HOSPITAL_COMMUNITY): Payer: PRIVATE HEALTH INSURANCE | Admitting: Anesthesiology

## 2015-03-09 ENCOUNTER — Ambulatory Visit (HOSPITAL_COMMUNITY)
Admission: RE | Admit: 2015-03-09 | Discharge: 2015-03-09 | Disposition: A | Discharge status: Home / Self Care | Payer: PRIVATE HEALTH INSURANCE | Source: Ambulatory Visit | Attending: General Surgery | Admitting: General Surgery

## 2015-03-09 ENCOUNTER — Ambulatory Visit (HOSPITAL_COMMUNITY): Payer: PRIVATE HEALTH INSURANCE

## 2015-03-09 ENCOUNTER — Encounter (HOSPITAL_COMMUNITY): Payer: Self-pay | Admitting: *Deleted

## 2015-03-09 ENCOUNTER — Other Ambulatory Visit (HOSPITAL_COMMUNITY): Payer: Self-pay | Admitting: *Deleted

## 2015-03-09 ENCOUNTER — Encounter (HOSPITAL_COMMUNITY)
Admission: RE | Disposition: A | Discharge status: Home / Self Care | Payer: Self-pay | Source: Ambulatory Visit | Attending: General Surgery

## 2015-03-09 DIAGNOSIS — Z888 Allergy status to other drugs, medicaments and biological substances status: Secondary | ICD-10-CM | POA: Diagnosis not present

## 2015-03-09 DIAGNOSIS — C169 Malignant neoplasm of stomach, unspecified: Secondary | ICD-10-CM | POA: Diagnosis not present

## 2015-03-09 DIAGNOSIS — Z880 Allergy status to penicillin: Secondary | ICD-10-CM | POA: Diagnosis not present

## 2015-03-09 DIAGNOSIS — K219 Gastro-esophageal reflux disease without esophagitis: Secondary | ICD-10-CM | POA: Insufficient documentation

## 2015-03-09 DIAGNOSIS — Z87891 Personal history of nicotine dependence: Secondary | ICD-10-CM | POA: Insufficient documentation

## 2015-03-09 DIAGNOSIS — Z903 Acquired absence of stomach [part of]: Secondary | ICD-10-CM | POA: Insufficient documentation

## 2015-03-09 DIAGNOSIS — Z882 Allergy status to sulfonamides status: Secondary | ICD-10-CM | POA: Diagnosis not present

## 2015-03-09 DIAGNOSIS — C160 Malignant neoplasm of cardia: Secondary | ICD-10-CM

## 2015-03-09 DIAGNOSIS — Z95828 Presence of other vascular implants and grafts: Secondary | ICD-10-CM

## 2015-03-09 HISTORY — PX: PORTACATH PLACEMENT: SHX2246

## 2015-03-09 SURGERY — INSERTION, TUNNELED CENTRAL VENOUS DEVICE, WITH PORT
Anesthesia: Monitor Anesthesia Care | Site: Chest

## 2015-03-09 MED ORDER — LIDOCAINE HCL (PF) 1 % IJ SOLN
INTRAMUSCULAR | Status: AC
  Filled 2015-03-09: qty 30

## 2015-03-09 MED ORDER — LACTATED RINGERS IV SOLN
INTRAVENOUS | Status: DC
  Administered 2015-03-09: 08:00:00 via INTRAVENOUS

## 2015-03-09 MED ORDER — PROPOFOL 500 MG/50ML IV EMUL
INTRAVENOUS | Status: DC | PRN
  Administered 2015-03-09: 35 ug/kg/min via INTRAVENOUS

## 2015-03-09 MED ORDER — KETOROLAC TROMETHAMINE 30 MG/ML IJ SOLN
30.0000 mg | Freq: Once | INTRAMUSCULAR | Status: AC
  Administered 2015-03-09: 30 mg via INTRAVENOUS
  Filled 2015-03-09: qty 1

## 2015-03-09 MED ORDER — HEPARIN SOD (PORK) LOCK FLUSH 100 UNIT/ML IV SOLN
INTRAVENOUS | Status: DC | PRN
  Administered 2015-03-09: 500 [IU] via INTRAVENOUS

## 2015-03-09 MED ORDER — MIDAZOLAM HCL 2 MG/2ML IJ SOLN
1.0000 mg | INTRAMUSCULAR | Status: DC | PRN
  Administered 2015-03-09: 2 mg via INTRAVENOUS
  Filled 2015-03-09: qty 2

## 2015-03-09 MED ORDER — HEPARIN SOD (PORK) LOCK FLUSH 100 UNIT/ML IV SOLN
INTRAVENOUS | Status: AC
  Filled 2015-03-09: qty 5

## 2015-03-09 MED ORDER — MIDAZOLAM HCL 5 MG/5ML IJ SOLN
INTRAMUSCULAR | Status: DC | PRN
  Administered 2015-03-09 (×2): 1 mg via INTRAVENOUS

## 2015-03-09 MED ORDER — PROMETHAZINE HCL 12.5 MG PO TABS: 12.5000 mg | ORAL_TABLET | Freq: Four times a day (QID) | ORAL | Status: AC | PRN

## 2015-03-09 MED ORDER — FENTANYL CITRATE (PF) 100 MCG/2ML IJ SOLN
INTRAMUSCULAR | Status: DC | PRN
  Administered 2015-03-09 (×2): 25 ug via INTRAVENOUS

## 2015-03-09 MED ORDER — SODIUM CHLORIDE 0.9 % IV SOLN
INTRAVENOUS | Status: DC | PRN
  Administered 2015-03-09: 500 mL via INTRAMUSCULAR

## 2015-03-09 MED ORDER — ONDANSETRON HCL 4 MG/2ML IJ SOLN: 4.0000 mg | Freq: Once | INTRAMUSCULAR | Status: DC | PRN

## 2015-03-09 MED ORDER — LIDOCAINE HCL (PF) 1 % IJ SOLN
INTRAMUSCULAR | Status: AC
  Filled 2015-03-09: qty 5

## 2015-03-09 MED ORDER — FENTANYL CITRATE (PF) 100 MCG/2ML IJ SOLN
INTRAMUSCULAR | Status: AC
  Filled 2015-03-09: qty 4

## 2015-03-09 MED ORDER — FENTANYL CITRATE (PF) 100 MCG/2ML IJ SOLN: 25.0000 ug | INTRAMUSCULAR | Status: DC | PRN

## 2015-03-09 MED ORDER — VANCOMYCIN HCL IN DEXTROSE 1-5 GM/200ML-% IV SOLN
1000.0000 mg | INTRAVENOUS | Status: AC
  Administered 2015-03-09: 1000 mg via INTRAVENOUS
  Filled 2015-03-09: qty 200

## 2015-03-09 MED ORDER — MIDAZOLAM HCL 2 MG/2ML IJ SOLN
INTRAMUSCULAR | Status: AC
  Filled 2015-03-09: qty 4

## 2015-03-09 MED ORDER — LIDOCAINE HCL (PF) 1 % IJ SOLN
INTRAMUSCULAR | Status: DC | PRN
  Administered 2015-03-09: 8 mL

## 2015-03-09 MED ORDER — DEXTROSE 5 % IV SOLN
INTRAVENOUS | Status: DC | PRN
  Administered 2015-03-09: 08:00:00 via INTRAVENOUS

## 2015-03-09 MED ORDER — PROPOFOL 10 MG/ML IV BOLUS
INTRAVENOUS | Status: AC
  Filled 2015-03-09: qty 20

## 2015-03-09 SURGICAL SUPPLY — 37 items
APPLIER CLIP 9.375 SM OPEN (CLIP)
BAG DECANTER FOR FLEXI CONT (MISCELLANEOUS) ×3 IMPLANT
BAG HAMPER (MISCELLANEOUS) ×3 IMPLANT
CATH HICKMAN DUAL 12.0 (CATHETERS) IMPLANT
CHLORAPREP W/TINT 10.5 ML (MISCELLANEOUS) ×3 IMPLANT
CLIP APPLIE 9.375 SM OPEN (CLIP) IMPLANT
CLOTH BEACON ORANGE TIMEOUT ST (SAFETY) ×3 IMPLANT
COVER LIGHT HANDLE STERIS (MISCELLANEOUS) ×6 IMPLANT
DECANTER SPIKE VIAL GLASS SM (MISCELLANEOUS) ×3 IMPLANT
DERMABOND ADVANCED (GAUZE/BANDAGES/DRESSINGS) ×2
DERMABOND ADVANCED .7 DNX12 (GAUZE/BANDAGES/DRESSINGS) ×1 IMPLANT
DRAPE C-ARM FOLDED MOBILE STRL (DRAPES) ×3 IMPLANT
ELECT REM PT RETURN 9FT ADLT (ELECTROSURGICAL) ×3
ELECTRODE REM PT RTRN 9FT ADLT (ELECTROSURGICAL) ×1 IMPLANT
GLOVE ECLIPSE 7.0 STRL STRAW (GLOVE) ×3 IMPLANT
GLOVE INDICATOR 7.0 STRL GRN (GLOVE) ×6 IMPLANT
GLOVE SURG SS PI 7.5 STRL IVOR (GLOVE) ×6 IMPLANT
GOWN STRL REUS W/TWL LRG LVL3 (GOWN DISPOSABLE) ×6 IMPLANT
IV NS 500ML (IV SOLUTION) ×2
IV NS 500ML BAXH (IV SOLUTION) ×1 IMPLANT
KIT PORT POWER 8FR ISP MRI (CATHETERS) ×3 IMPLANT
KIT PORT POWER ISP 8FR (Catheter) ×3 IMPLANT
KIT ROOM TURNOVER APOR (KITS) ×3 IMPLANT
LIQUID BAND (GAUZE/BANDAGES/DRESSINGS) ×3 IMPLANT
MANIFOLD NEPTUNE II (INSTRUMENTS) ×3 IMPLANT
NEEDLE HYPO 25X1 1.5 SAFETY (NEEDLE) ×3 IMPLANT
PACK MINOR (CUSTOM PROCEDURE TRAY) ×3 IMPLANT
PAD ARMBOARD 7.5X6 YLW CONV (MISCELLANEOUS) ×3 IMPLANT
SET BASIN LINEN APH (SET/KITS/TRAYS/PACK) ×3 IMPLANT
SET INTRODUCER 12FR PACEMAKER (SHEATH) IMPLANT
SHEATH COOK PEEL AWAY SET 8F (SHEATH) IMPLANT
SUT PROLENE 3 0 PS 2 (SUTURE) IMPLANT
SUT VIC AB 3-0 SH 27 (SUTURE) ×2
SUT VIC AB 3-0 SH 27X BRD (SUTURE) ×1 IMPLANT
SUT VIC AB 4-0 PS2 27 (SUTURE) ×3 IMPLANT
SYR 20CC LL (SYRINGE) ×3 IMPLANT
SYR CONTROL 10ML LL (SYRINGE) ×3 IMPLANT

## 2015-03-09 NOTE — Anesthesia Postprocedure Evaluation (Signed)
  Anesthesia Post-op Note  Patient: Heidi Cooper  Procedure(s) Performed: Procedure(s): INSERTION PORT-A-CATH-left subclavian (N/A)  Patient Location: PACU  Anesthesia Type:MAC  Level of Consciousness: awake, alert , oriented and patient cooperative  Airway and Oxygen Therapy: Patient Spontanous Breathing  Post-op Pain: none  Post-op Assessment: Post-op Vital signs reviewed, Patient's Cardiovascular Status Stable, Respiratory Function Stable, Patent Airway, No signs of Nausea or vomiting and Pain level controlled              Post-op Vital Signs: Reviewed and stable  Last Vitals:  Filed Vitals:   03/09/15 0900  BP: 120/68  Temp:   Resp: 24    Complications: No apparent anesthesia complications

## 2015-03-09 NOTE — Op Note (Signed)
Patient:  Heidi Cooper  DOB:  Mar 20, 1956  MRN:  [id number]   Preop Diagnosis:  Gastric carcinoma, need for central venous access  Postop Diagnosis:  Same  Procedure:  Port-A-Cath insertion  Surgeon:  Aviva Signs, M.D.  Anes:  Mac  Indications:  Patient is a 59 year old white female recently diagnosed with gastric carcinoma who now presents for a Port-A-Cath. She is about to undergo chemotherapy. The risks and benefits of the procedure including bleeding, infection, and pneumothorax were fully explained to the patient, who gave informed consent.  Procedure note:  The patient was placed the supine position. After monitored anesthesia care was started, the left upper chest was prepped and draped using the usual sterile technique with DuraPrep. Surgical site confirmation was performed. 1% Xylocaine was used for local anesthesia.  A transverse incision was made below the left clavicle. A subcutaneous pocket was formed. While in Trendelenburg position, a needle is advanced into the left subclavian vein using the Seldinger technique without difficulty. A guidewire was then advanced under fluoroscopic guidance into the right atrium. An introducer and peel-away sheath were placed over the guidewire. The cath was then inserted through the peel-away sheath the peel-away sheath was removed. The catheter was then attached to the port and the port placed in its subcutaneous pocket. A power port was inserted. Adequate positioning was confirmed by fluoroscopy. Good backflow blood was noted from the port. The port was flushed with heparin flush. The subcutaneous layer was reapproximated using a 3-0 Vicryl interrupted suture. The skin was closed using a 4-0 Vicryl subcuticular suture. Dermabond was applied.  All tape and needle counts were correct at the end of the procedure. Patient was transferred to PACU in stable condition. A chest x-ray will be performed at that time.   Complications:  None  EBL:   Minimal  Specimen:  None

## 2015-03-09 NOTE — Anesthesia Preprocedure Evaluation (Addendum)
Anesthesia Evaluation  Patient identified by MRN, date of birth, ID band Patient awake    Reviewed: Allergy & Precautions, NPO status , Patient's Chart, lab work & pertinent test results, Unable to perform ROS - Chart review only  Airway Mallampati: I  TM Distance: >3 FB Neck ROM: Full    Dental  (+) Teeth Intact,    Pulmonary former smoker,    Pulmonary exam normal        Cardiovascular Normal cardiovascular exam     Neuro/Psych  Headaches,    GI/Hepatic GERD  Medicated and Controlled,  Endo/Other    Renal/GU      Musculoskeletal   Abdominal Normal abdominal exam  (+)   Peds  Hematology   Anesthesia Other Findings   Reproductive/Obstetrics                           Anesthesia Physical Anesthesia Plan  ASA: II  Anesthesia Plan: MAC   Post-op Pain Management:    Induction: Intravenous  Airway Management Planned: Nasal Cannula  Additional Equipment:   Intra-op Plan:   Post-operative Plan:   Informed Consent: I have reviewed the patients History and Physical, chart, labs and discussed the procedure including the risks, benefits and alternatives for the proposed anesthesia with the patient or authorized representative who has indicated his/her understanding and acceptance.   Dental advisory given  Plan Discussed with: CRNA  Anesthesia Plan Comments:        Anesthesia Quick Evaluation

## 2015-03-09 NOTE — Transfer of Care (Signed)
Immediate Anesthesia Transfer of Care Note  Patient: Heidi Cooper  Procedure(s) Performed: Procedure(s): INSERTION PORT-A-CATH-left subclavian (N/A)  Patient Location: PACU  Anesthesia Type:MAC  Level of Consciousness: awake and patient cooperative  Airway & Oxygen Therapy: Patient Spontanous Breathing and Patient connected to face mask oxygen  Post-op Assessment: Report given to RN, Post -op Vital signs reviewed and stable and Patient moving all extremities  Post vital signs: Reviewed and stable  Last Vitals:  Filed Vitals:   03/09/15 0810  Temp: 93.2 C    Complications: No apparent anesthesia complications

## 2015-03-09 NOTE — Interval H&P Note (Signed)
History and Physical Interval Note:  03/09/2015 8:55 AM  Heidi Cooper  has presented today for surgery, with the diagnosis of stomach cancer  The various methods of treatment have been discussed with the patient and family. After consideration of risks, benefits and other options for treatment, the patient has consented to  Procedure(s): INSERTION PORT-A-CATH (N/A) as a surgical intervention .  The patient's history has been reviewed, patient examined, no change in status, stable for surgery.  I have reviewed the patient's chart and labs.  Questions were answered to the patient's satisfaction.     Aviva Signs A

## 2015-03-09 NOTE — Discharge Instructions (Signed)
Implanted Port Insertion, Care After °Refer to this sheet in the next few weeks. These instructions provide you with information on caring for yourself after your procedure. Your health care provider may also give you more specific instructions. Your treatment has been planned according to current medical practices, but problems sometimes occur. Call your health care provider if you have any problems or questions after your procedure. °WHAT TO EXPECT AFTER THE PROCEDURE °After your procedure, it is typical to have the following:  °· Discomfort at the port insertion site. Ice packs to the area will help. °· Bruising on the skin over the port. This will subside in 3-4 days. °HOME CARE INSTRUCTIONS °· After your port is placed, you will get a manufacturer's information card. The card has information about your port. Keep this card with you at all times.   °· Know what kind of port you have. There are many types of ports available.   °· Wear a medical alert bracelet in case of an emergency. This can help alert health care workers that you have a port.   °· The port can stay in for as long as your health care provider believes it is necessary.   °· A home health care nurse may give medicines and take care of the port.   °· You or a family member can get special training and directions for giving medicine and taking care of the port at home.   °SEEK MEDICAL CARE IF:  °· Your port does not flush or you are unable to get a blood return.   °· You have a fever or chills. °SEEK IMMEDIATE MEDICAL CARE IF: °· You have new fluid or pus coming from your incision.   °· You notice a bad smell coming from your incision site.   °· You have swelling, pain, or more redness at the incision or port site.   °· You have chest pain or shortness of breath. °  °This information is not intended to replace advice given to you by your health care provider. Make sure you discuss any questions you have with your health care provider. °  °Document  Released: 02/23/2013 Document Revised: 05/10/2013 Document Reviewed: 02/23/2013 °Elsevier Interactive Patient Education ©2016 [employer] °Implanted Port Home Guide °An implanted port is a type of central line that is placed under the skin. Central lines are used to provide IV access when treatment or nutrition needs to be given through a person's veins. Implanted ports are used for long-term IV access. An implanted port may be placed because:  °· You need IV medicine that would be irritating to the small veins in your hands or arms.   °· You need long-term IV medicines, such as antibiotics.   °· You need IV nutrition for a long period.   °· You need frequent blood draws for lab tests.   °· You need dialysis.   °Implanted ports are usually placed in the chest area, but they can also be placed in the upper arm, the abdomen, or the leg. An implanted port has two main parts:  °· Reservoir. The reservoir is round and will appear as a small, raised area under your skin. The reservoir is the part where a needle is inserted to give medicines or draw blood.   °· Catheter. The catheter is a thin, flexible tube that extends from the reservoir. The catheter is placed into a large vein. Medicine that is inserted into the reservoir goes into the catheter and then into the vein.   °HOW WILL I CARE FOR MY INCISION SITE? °Do not get the   incision site wet. Bathe or shower as directed by your health care provider.  °HOW IS MY PORT ACCESSED? °Special steps must be taken to access the port:  °· Before the port is accessed, a numbing cream can be placed on the skin. This helps numb the skin over the port site.   °· Your health care provider uses a sterile technique to access the port. °· Your health care provider must put on a mask and sterile gloves. °· The skin over your port is cleaned carefully with an antiseptic and allowed to dry. °· The port is gently pinched between sterile gloves, and a needle is inserted into the  port. °· Only "non-coring" port needles should be used to access the port. Once the port is accessed, a blood return should be checked. This helps ensure that the port is in the vein and is not clogged.   °· If your port needs to remain accessed for a constant infusion, a clear (transparent) bandage will be placed over the needle site. The bandage and needle will need to be changed every week, or as directed by your health care provider.   °· Keep the bandage covering the needle clean and dry. Do not get it wet. Follow your health care provider's instructions on how to take a shower or bath while the port is accessed.   °· If your port does not need to stay accessed, no bandage is needed over the port.   °WHAT IS FLUSHING? °Flushing helps keep the port from getting clogged. Follow your health care provider's instructions on how and when to flush the port. Ports are usually flushed with saline solution or a medicine called heparin. The need for flushing will depend on how the port is used.  °· If the port is used for intermittent medicines or blood draws, the port will need to be flushed:   °· After medicines have been given.   °· After blood has been drawn.   °· As part of routine maintenance.   °· If a constant infusion is running, the port may not need to be flushed.   °HOW LONG WILL MY PORT STAY IMPLANTED? °The port can stay in for as long as your health care provider thinks it is needed. When it is time for the port to come out, surgery will be done to remove it. The procedure is similar to the one performed when the port was put in.  °WHEN SHOULD I SEEK IMMEDIATE MEDICAL CARE? °When you have an implanted port, you should seek immediate medical care if:  °· You notice a bad smell coming from the incision site.   °· You have swelling, redness, or drainage at the incision site.   °· You have more swelling or pain at the port site or the surrounding area.   °· You have a fever that is not controlled with  medicine. °  °This information is not intended to replace advice given to you by your health care provider. Make sure you discuss any questions you have with your health care provider. °  °Document Released: 05/05/2005 Document Revised: 02/23/2013 Document Reviewed: 01/10/2013 °Elsevier Interactive Patient Education ©2016 [employer] ° °

## 2015-03-12 ENCOUNTER — Ambulatory Visit: Payer: PRIVATE HEALTH INSURANCE | Admitting: Radiation Oncology

## 2015-03-12 ENCOUNTER — Encounter (HOSPITAL_COMMUNITY): Payer: Self-pay | Admitting: General Surgery

## 2015-03-12 ENCOUNTER — Ambulatory Visit: Payer: PRIVATE HEALTH INSURANCE

## 2015-03-14 ENCOUNTER — Encounter (HOSPITAL_BASED_OUTPATIENT_CLINIC_OR_DEPARTMENT_OTHER): Payer: PRIVATE HEALTH INSURANCE

## 2015-03-14 ENCOUNTER — Encounter (HOSPITAL_COMMUNITY): Payer: Self-pay

## 2015-03-14 DIAGNOSIS — D649 Anemia, unspecified: Secondary | ICD-10-CM

## 2015-03-14 MED ORDER — SODIUM CHLORIDE 0.9 % IV SOLN
INTRAVENOUS | Status: DC
  Administered 2015-03-14: 13:00:00 via INTRAVENOUS

## 2015-03-14 MED ORDER — SODIUM CHLORIDE 0.9 % IV SOLN
125.0000 mg | Freq: Once | INTRAVENOUS | Status: AC
  Administered 2015-03-14: 125 mg via INTRAVENOUS
  Filled 2015-03-14: qty 10

## 2015-03-14 NOTE — Progress Notes (Signed)
Tolerated infusion w/o adverse reaction.  VSS.  A&O4; in no distress.  Discharged ambulatory.

## 2015-03-14 NOTE — Patient Instructions (Signed)
[place] at [facility] Discharge Instructions  RECOMMENDATIONS MADE BY THE CONSULTANT AND ANY TEST RESULTS WILL BE SENT TO YOUR REFERRING PHYSICIAN.  Iron infusion today. Chemotherapy to start 03/20/15.  Thank you for choosing [place] at [facility] to provide your oncology and hematology care.  To afford each patient quality time with our provider, please arrive at least 15 minutes before your scheduled appointment time.    You need to re-schedule your appointment should you arrive 10 or more minutes late.  We strive to give you quality time with our providers, and arriving late affects you and other patients whose appointments are after yours.  Also, if you no show three or more times for appointments you may be dismissed from the clinic at the providers discretion.     Again, thank you for choosing [facility].  Our hope is that these requests will decrease the amount of time that you wait before being seen by our physicians.       _____________________________________________________________  Should you have questions after your visit to [facility], please contact our office at [phone] between the hours of 8:30 a.m. and 4:30 p.m.  Voicemails left after 4:30 p.m. will not be returned until the following business day.  For prescription refill requests, have your pharmacy contact our office.

## 2015-03-15 ENCOUNTER — Telehealth (HOSPITAL_COMMUNITY): Payer: Self-pay | Admitting: Hematology & Oncology

## 2015-03-15 NOTE — Telephone Encounter (Signed)
[facility] [phone] P [place] # [phone]

## 2015-03-19 NOTE — Progress Notes (Signed)
[name], [place] [id number]  Malignant neoplasm of cardia of stomach ([place])  CURRENT THERAPY: Beginning first cycle of chemotherapy with 5FU/Leucovorin day 1 and 15.  INTERVAL HISTORY: Donyae Kohn 59 y.o. 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 Negative margins at time of surgery, and 2/14 positive lymph nodes for metastatic disease.    Gastric cancer ([place])   01/21/2015 Imaging CT abd/pelvis- Gastroptosis with massive enlargement of the stomach, but no acute bowel obstruction. Query severe gastroparesis. See coronal image 33. No other acute or inflammatory process identified in the abdomen or pelvis. Diverticulosis   02/06/2015 - 02/19/2015 Hospital Admission Nausea with vomiting, Gastric outlet obstruction, Malnutrition of moderate degree ([place])   02/06/2015 Procedure EGD by Dr. Oneida Alar   02/06/2015 Pathology Results Stomach, biopsy, pre-pylori - ULCERATED, POORLY DIFFERENTIATED ADENOCARCINOMA   02/09/2015 Definitive Surgery Dr. Arnoldo Morale- partial gastrectomy (distal antrectomy) with Roux-en-Y gastrojejunostomy, right hemicolectomy.   02/09/2015 Pathology Results Stomach, resection for tumor, distal stomach SIGNET RING CELL CARCINOMA (4.5 CM, 1 CM IN DEPTH) TUMOR INVADES VISCERAL PERITONEUM LYMPHOVASCULAR INVASION IDENTIFIED,MARGINS OF RESECTION ARE NEGATIVE FOR TUMOR METASTATIC CARCINOMA IN 2/14   02/09/2015 Pathology Results HER NEGATIVE   02/12/2015 Imaging DG UGI- Patent gastrojejunostomy anastomosis post distal gastrectomy. No evidence of contrast extravasation to suggest anastomotic leak.   03/20/2015 -  Chemotherapy 5FU/Leucovorin days 1, and 15 x 1 cycle.    I personally reviewed and went over laboratory results with the patient.  The results are noted within this dictation.  Labs today meet treatment parameters.  She and  her sister are going to look into the healthcare marketplace today.  She is not happy about having to transfer her care under her current insurance to [place].  They will keep [place] informed of the results of her health insurance search.  She has not complaints this AM.  She is seen by Abby Potash today and Abby Potash notes a number of financial issues.   Past Medical History  Diagnosis Date  . Migraine   . Gastric cancer ([place]) 02/27/2015    has Abnormal weight loss; Abdominal pain, epigastric; Nausea with vomiting; RLQ abdominal mass; Gastric outlet obstruction; Malnutrition of moderate degree ([place]); GERD (gastroesophageal reflux disease); Hypokalemia; Chronic migraine; and Gastric cancer ([place]) on her problem list.     is allergic to other; sulfa antibiotics; and penicillins.  Current Outpatient Prescriptions on File Prior to Visit  Medication Sig Dispense Refill  . calcium carbonate (TUMS - DOSED IN MG ELEMENTAL CALCIUM) 500 MG chewable tablet Chew 1 tablet by mouth as needed for indigestion or heartburn.    . calcium citrate-vitamin D (CITRACAL+D) 315-200 MG-UNIT tablet Take 1 tablet by mouth daily.    . Cholecalciferol (VITAMIN D3) 2000 UNITS TABS Take 1 tablet by mouth daily.    . cyanocobalamin 500 MCG tablet Take 500 mcg by mouth daily.    Marland Kitchen dextrose 5 % SOLN 1,000 mL with fluorouracil 5 GM/100ML SOLN Inject into the vein. To start around first of November tentatively    . escitalopram (LEXAPRO) 20 MG tablet Take 1/2 tab for 5 days then increase to 1 tab daily. (Patient taking differently: Take 20 mg by mouth daily. ) 30 tablet 3  . fluticasone (FLONASE) 50 MCG/ACT nasal spray Place 2 sprays into the nose daily as needed for allergies.     Marland Kitchen  HYDROcodone-acetaminophen (NORCO/VICODIN) 5-325 MG tablet Take 1-2 tablets by mouth every 4 (four) hours as needed for moderate pain. (Patient not taking: Reported on 03/20/2015) 50 tablet 0  . lidocaine-prilocaine (EMLA) cream Apply a quarter size amount to port  site 1 hour prior to chemo. Do not rub in. Cover with plastic wrap. 30 g 2  . loperamide (IMODIUM) 2 MG capsule Take 2 mg by mouth as needed for diarrhea or loose stools.    . ondansetron (ZOFRAN ODT) 4 MG disintegrating tablet Take 1 tablet (4 mg total) by mouth every 8 (eight) hours as needed for nausea. (Patient not taking: Reported on 03/20/2015) 30 tablet 1  . polyethylene glycol (MIRALAX / GLYCOLAX) packet Take 17 g by mouth as needed.    . potassium chloride (K-DUR) 10 MEQ tablet Take 1 tablet (10 mEq total) by mouth 2 (two) times daily. (Patient not taking: Reported on 03/20/2015) 28 tablet 0  . promethazine (PHENERGAN) 12.5 MG tablet Take 1 tablet (12.5 mg total) by mouth every 6 (six) hours as needed for nausea or vomiting. (Patient not taking: Reported on 03/20/2015) 30 tablet 1  . Sennosides (SENOKOT PO) Take 1 tablet by mouth as needed.    . topiramate (TOPAMAX) 25 MG tablet Take 25 mg by mouth at bedtime.      Current Facility-Administered Medications on File Prior to Visit  Medication Dose Route Frequency Provider Last Rate Last Dose  . fluorouracil (ADRUCIL) 850 mg in sodium chloride 0.9 % 133 mL chemo infusion  600 mg/m2 (Treatment Plan Actual) Intravenous 1 day or 1 dose Patrici Ranks, MD      . fluorouracil (ADRUCIL) chemo injection 550 mg  400 mg/m2 (Treatment Plan Actual) Intravenous Once Patrici Ranks, MD      . leucovorin 560 mg in dextrose 5 % 250 mL infusion  400 mg/m2 (Treatment Plan Actual) Intravenous Once Patrici Ranks, MD 139 mL/hr at 03/20/15 1029 560 mg at 03/20/15 1029  . sodium chloride 0.9 % injection 10 mL  10 mL Intracatheter PRN Patrici Ranks, MD   10 mL at 03/20/15 0930    Past Surgical History  Procedure Laterality Date  . Tonsillectomy    . Esophagogastroduodenoscopy N/A 02/06/2015    Procedure: ESOPHAGOGASTRODUODENOSCOPY (EGD);  Surgeon: Danie Binder, MD;  Location: [facility];  Service: Endoscopy;  Laterality: N/A;  . Gastrectomy N/A  02/09/2015    Procedure: DISTAL GASTRECTOMY WITH GASTROJEJUNOSTOMY; RIGHT HEMICOLECTOMY;  Surgeon: Aviva Signs, MD;  Location: [facility] ORS;  Service: General;  Laterality: N/A;  . Portacath placement N/A 03/09/2015    Procedure: INSERTION PORT-A-CATH-left subclavian;  Surgeon: Aviva Signs, MD;  Location: [facility] ORS;  Service: General;  Laterality: N/A;    Denies any headaches, dizziness, double vision, fevers, chills, night sweats, nausea, vomiting, diarrhea, constipation, chest pain, heart palpitations, shortness of breath, blood in stool, black tarry stool, urinary pain, urinary burning, urinary frequency, hematuria.   PHYSICAL EXAMINATION  ECOG PERFORMANCE STATUS: 1 - Symptomatic but completely ambulatory  There were no vitals filed for this visit.  GENERAL:alert, no distress, cachectic, comfortable, cooperative, smiling and accompanied by her sister and in a chemo-recliner SKIN: skin color, texture, turgor are normal, no rashes or significant lesions HEAD: Normocephalic, No masses, lesions, tenderness or abnormalities EYES: normal, PERRLA, EOMI, Conjunctiva are pink and non-injected EARS: External ears normal OROPHARYNX:lips, buccal mucosa, and tongue normal and mucous membranes are moist  NECK: supple, trachea midline LYMPH:  not examined BREAST:not examined LUNGS: clear to auscultation  HEART: regular rate & rhythm ABDOMEN:abdomen soft and non-tender BACK: No CVA tenderness EXTREMITIES:less then 2 second capillary refill, no joint deformities, effusion, or inflammation, no skin discoloration, no cyanosis  NEURO: alert & oriented x 3 with fluent speech, no focal motor/sensory deficits   LABORATORY DATA: CBC    Component Value Date/Time   WBC 7.1 03/20/2015 0950   RBC 3.26* 03/20/2015 0950   HGB 10.5* 03/20/2015 0950   HCT 31.9* 03/20/2015 0950   PLT 249 03/20/2015 0950   MCV 97.9 03/20/2015 0950   MCH 32.2 03/20/2015 0950   MCHC 32.9 03/20/2015 0950   RDW 13.6 03/20/2015 0950     LYMPHSABS 1.3 03/20/2015 0950   MONOABS 0.4 03/20/2015 0950   EOSABS 0.1 03/20/2015 0950   BASOSABS 0.0 03/20/2015 0950      Chemistry      Component Value Date/Time   NA 141 03/20/2015 0950   K 4.0 03/20/2015 0950   CL 111 03/20/2015 0950   CO2 24 03/20/2015 0950   BUN 16 03/20/2015 0950   CREATININE 0.75 03/20/2015 0950      Component Value Date/Time   CALCIUM 9.0 03/20/2015 0950   ALKPHOS 39 03/20/2015 0950   AST 20 03/20/2015 0950   ALT 14 03/20/2015 0950   BILITOT 0.5 03/20/2015 0950        PENDING LABS:   RADIOGRAPHIC STUDIES:  Ct Chest W Contrast  03/05/2015  CLINICAL DATA:  Staging for gastric antral pyloric cancer (signet ring cell carcinoma of the antrum with invasion of the visceral peri a.m.). Two out of 14 lymph nodes were positive for metastatic disease. Surgery in September 2016. EXAM: CT CHEST, ABDOMEN, AND PELVIS WITH CONTRAST TECHNIQUE: Multidetector CT imaging of the chest, abdomen and pelvis was performed following the standard protocol during bolus administration of intravenous contrast. CONTRAST:  17mL OMNIPAQUE IOHEXOL 300 MG/ML  SOLN COMPARISON:  None. FINDINGS: CT CHEST FINDINGS Mediastinum/Nodes: Contrast medium in the esophagus compatible with dysmotility or reflux. Small mediastinal lymph nodes are not pathologically enlarged by size criteria. Lungs/Pleura: Biapical pleural-parenchymal scarring appear symmetric. No findings of metastatic disease to the lungs. Musculoskeletal: Mild thoracic spondylosis. CT ABDOMEN PELVIS FINDINGS Hepatobiliary: Borderline gallbladder wall thickening. Pancreas: Unremarkable Spleen: Unremarkable Adrenals/Urinary Tract: Unremarkable Stomach/Bowel: Partial gastrectomy. Stranding in the adjacent upper omentum as on image 79 of series 2 merits observation although may simply be postoperative. Gastrojejunostomy widely patent. Right hemicolectomy. Sigmoid diverticulosis. Vascular/Lymphatic: Minimal abdominal aortic  atherosclerosis. No individually identifiable pathologically enlarged lymph nodes. Reproductive: Calcified uterine lesions including dense calcifications in a 2.6 cm presumed fibroid of the uterine fundus. Other: Stranding in the upper omentum as noted above, quite possibly postoperative but will merit observation. This is shown for example on image 24 series 3 and image 81 series 2. Musculoskeletal: Levoconvex lumbar rotary scoliosis. IMPRESSION: 1. Postoperative for gastrojejunostomy and right hemicolectomy. No specific findings of residual or recurrent tumor, although there is some stranding in the upper omentum which may be postoperative but which warrants observation. 2. Borderline gallbladder wall thickening. 3. Calcified uterine fibroids. 4. Levoconvex lumbar rotary scoliosis. 5. Sigmoid diverticulosis. Electronically Signed   By: Van Clines M.D.   On: 03/05/2015 14:36   Ct Abdomen Pelvis W Contrast  03/05/2015  CLINICAL DATA:  Staging for gastric antral pyloric cancer (signet ring cell carcinoma of the antrum with invasion of the visceral peri a.m.). Two out of 14 lymph nodes were positive for metastatic disease. Surgery in September 2016. EXAM: CT CHEST, ABDOMEN, AND PELVIS WITH CONTRAST  TECHNIQUE: Multidetector CT imaging of the chest, abdomen and pelvis was performed following the standard protocol during bolus administration of intravenous contrast. CONTRAST:  189mL OMNIPAQUE IOHEXOL 300 MG/ML  SOLN COMPARISON:  None. FINDINGS: CT CHEST FINDINGS Mediastinum/Nodes: Contrast medium in the esophagus compatible with dysmotility or reflux. Small mediastinal lymph nodes are not pathologically enlarged by size criteria. Lungs/Pleura: Biapical pleural-parenchymal scarring appear symmetric. No findings of metastatic disease to the lungs. Musculoskeletal: Mild thoracic spondylosis. CT ABDOMEN PELVIS FINDINGS Hepatobiliary: Borderline gallbladder wall thickening. Pancreas: Unremarkable Spleen:  Unremarkable Adrenals/Urinary Tract: Unremarkable Stomach/Bowel: Partial gastrectomy. Stranding in the adjacent upper omentum as on image 79 of series 2 merits observation although may simply be postoperative. Gastrojejunostomy widely patent. Right hemicolectomy. Sigmoid diverticulosis. Vascular/Lymphatic: Minimal abdominal aortic atherosclerosis. No individually identifiable pathologically enlarged lymph nodes. Reproductive: Calcified uterine lesions including dense calcifications in a 2.6 cm presumed fibroid of the uterine fundus. Other: Stranding in the upper omentum as noted above, quite possibly postoperative but will merit observation. This is shown for example on image 24 series 3 and image 81 series 2. Musculoskeletal: Levoconvex lumbar rotary scoliosis. IMPRESSION: 1. Postoperative for gastrojejunostomy and right hemicolectomy. No specific findings of residual or recurrent tumor, although there is some stranding in the upper omentum which may be postoperative but which warrants observation. 2. Borderline gallbladder wall thickening. 3. Calcified uterine fibroids. 4. Levoconvex lumbar rotary scoliosis. 5. Sigmoid diverticulosis. Electronically Signed   By: Van Clines M.D.   On: 03/05/2015 14:36   Dg Chest Port 1 View  03/09/2015  CLINICAL DATA:  Port-A-Cath placement. EXAM: DG C-ARM 61-120 MIN; PORTABLE CHEST - 1 VIEW COMPARISON:  Chest x-ray 02/08/2015 FINDINGS: The cardiac silhouette, mediastinal and hilar contours are normal. The lungs are clear. The left subclavian power port tip is in the mid SVC. No complicating features. IMPRESSION: Left subclavian power port tip is in the mid SVC. No complicating features. Electronically Signed   By: Marijo Sanes M.D.   On: 03/09/2015 10:13   Dg C-arm 1-60 Min  03/09/2015  CLINICAL DATA:  Port-A-Cath placement. EXAM: DG C-ARM 61-120 MIN; PORTABLE CHEST - 1 VIEW COMPARISON:  Chest x-ray 02/08/2015 FINDINGS: The cardiac silhouette, mediastinal and  hilar contours are normal. The lungs are clear. The left subclavian power port tip is in the mid SVC. No complicating features. IMPRESSION: Left subclavian power port tip is in the mid SVC. No complicating features. Electronically Signed   By: Marijo Sanes M.D.   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 Negative margins at time of surgery, and 2/14 positive lymph nodes for metastatic disease.  Oncology history is updated.  Pre-chemo labs today  Starting cycle of chemotherapy consisting of 5FU/Leucovorin on days 1 and 15.  This will be followed by concomitant chemoradiation with 5FU  Return in 1 week for chemotherapy tolerance check and then in 2 weeks for follow-up and next treatment (day 15 of cycle 1).    THERAPY PLAN:  Start treatment today as planned.  All questions were answered. The patient knows to call the clinic with any problems, questions or concerns. We can certainly see the patient much sooner if necessary.  Patient and plan discussed with Dr. Ancil Linsey and she is in agreement with the aforementioned.   This note is electronically signed by: Doy Mince  03/20/2015 12:28 PM

## 2015-03-19 NOTE — Assessment & Plan Note (Addendum)
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 Negative margins at time of surgery, and 2/14 positive lymph nodes for metastatic disease.  Oncology history is updated.  Pre-chemo labs today  Starting cycle of chemotherapy consisting of 5FU/Leucovorin on days 1 and 15.  This will be followed by concomitant chemoradiation with 5FU  Return in 1 week for chemotherapy tolerance check and then in 2 weeks for follow-up and next treatment (day 15 of cycle 1).

## 2015-03-20 ENCOUNTER — Encounter (HOSPITAL_COMMUNITY): Payer: PRIVATE HEALTH INSURANCE | Attending: Oncology

## 2015-03-20 ENCOUNTER — Encounter (HOSPITAL_BASED_OUTPATIENT_CLINIC_OR_DEPARTMENT_OTHER): Payer: PRIVATE HEALTH INSURANCE | Admitting: Oncology

## 2015-03-20 ENCOUNTER — Encounter: Payer: Self-pay | Admitting: *Deleted

## 2015-03-20 VITALS — BP 121/60 | HR 63 | Temp 97.9°F | Resp 16 | Wt 99.1 lb

## 2015-03-20 DIAGNOSIS — C779 Secondary and unspecified malignant neoplasm of lymph node, unspecified: Secondary | ICD-10-CM

## 2015-03-20 DIAGNOSIS — Z5111 Encounter for antineoplastic chemotherapy: Secondary | ICD-10-CM | POA: Diagnosis not present

## 2015-03-20 DIAGNOSIS — R112 Nausea with vomiting, unspecified: Secondary | ICD-10-CM | POA: Diagnosis present

## 2015-03-20 DIAGNOSIS — C163 Malignant neoplasm of pyloric antrum: Secondary | ICD-10-CM | POA: Diagnosis present

## 2015-03-20 DIAGNOSIS — C160 Malignant neoplasm of cardia: Secondary | ICD-10-CM | POA: Insufficient documentation

## 2015-03-20 DIAGNOSIS — C169 Malignant neoplasm of stomach, unspecified: Secondary | ICD-10-CM | POA: Insufficient documentation

## 2015-03-20 LAB — CBC WITH DIFFERENTIAL/PLATELET
BASOS ABS: 0 10*3/uL (ref 0.0–0.1)
Basophils Relative: 0 %
EOS PCT: 2 %
Eosinophils Absolute: 0.1 10*3/uL (ref 0.0–0.7)
HCT: 31.9 % — ABNORMAL LOW (ref 36.0–46.0)
Hemoglobin: 10.5 g/dL — ABNORMAL LOW (ref 12.0–15.0)
LYMPHS ABS: 1.3 10*3/uL (ref 0.7–4.0)
LYMPHS PCT: 18 %
MCH: 32.2 pg (ref 26.0–34.0)
MCHC: 32.9 g/dL (ref 30.0–36.0)
MCV: 97.9 fL (ref 78.0–100.0)
MONO ABS: 0.4 10*3/uL (ref 0.1–1.0)
MONOS PCT: 6 %
Neutro Abs: 5.3 10*3/uL (ref 1.7–7.7)
Neutrophils Relative %: 74 %
PLATELETS: 249 10*3/uL (ref 150–400)
RBC: 3.26 MIL/uL — ABNORMAL LOW (ref 3.87–5.11)
RDW: 13.6 % (ref 11.5–15.5)
WBC: 7.1 10*3/uL (ref 4.0–10.5)

## 2015-03-20 LAB — COMPREHENSIVE METABOLIC PANEL
ALT: 14 U/L (ref 14–54)
AST: 20 U/L (ref 15–41)
Albumin: 3.7 g/dL (ref 3.5–5.0)
Alkaline Phosphatase: 39 U/L (ref 38–126)
Anion gap: 6 (ref 5–15)
BUN: 16 mg/dL (ref 6–20)
CHLORIDE: 111 mmol/L (ref 101–111)
CO2: 24 mmol/L (ref 22–32)
Calcium: 9 mg/dL (ref 8.9–10.3)
Creatinine, Ser: 0.75 mg/dL (ref 0.44–1.00)
Glucose, Bld: 95 mg/dL (ref 65–99)
POTASSIUM: 4 mmol/L (ref 3.5–5.1)
SODIUM: 141 mmol/L (ref 135–145)
Total Bilirubin: 0.5 mg/dL (ref 0.3–1.2)
Total Protein: 5.9 g/dL — ABNORMAL LOW (ref 6.5–8.1)

## 2015-03-20 MED ORDER — PALONOSETRON HCL INJECTION 0.25 MG/5ML
0.2500 mg | Freq: Once | INTRAVENOUS | Status: AC
  Administered 2015-03-20: 0.25 mg via INTRAVENOUS
  Filled 2015-03-20: qty 5

## 2015-03-20 MED ORDER — SODIUM CHLORIDE 0.9 % IJ SOLN
10.0000 mL | INTRAMUSCULAR | Status: DC | PRN
  Administered 2015-03-20: 10 mL
  Filled 2015-03-20: qty 10

## 2015-03-20 MED ORDER — SODIUM CHLORIDE 0.9 % IV SOLN
600.0000 mg/m2 | INTRAVENOUS | Status: DC
  Administered 2015-03-20: 850 mg via INTRAVENOUS
  Filled 2015-03-20: qty 17

## 2015-03-20 MED ORDER — LEUCOVORIN CALCIUM INJECTION 350 MG
400.0000 mg/m2 | Freq: Once | INTRAMUSCULAR | Status: AC
  Administered 2015-03-20: 560 mg via INTRAVENOUS
  Filled 2015-03-20: qty 28

## 2015-03-20 MED ORDER — SODIUM CHLORIDE 0.9 % IV SOLN
12.0000 mg | Freq: Once | INTRAVENOUS | Status: AC
  Administered 2015-03-20: 12 mg via INTRAVENOUS
  Filled 2015-03-20: qty 1.2

## 2015-03-20 MED ORDER — SODIUM CHLORIDE 0.9 % IV SOLN
Freq: Once | INTRAVENOUS | Status: AC
  Administered 2015-03-20: 10:00:00 via INTRAVENOUS

## 2015-03-20 MED ORDER — FLUOROURACIL CHEMO INJECTION 2.5 GM/50ML
400.0000 mg/m2 | Freq: Once | INTRAVENOUS | Status: AC
  Administered 2015-03-20: 550 mg via INTRAVENOUS
  Filled 2015-03-20: qty 11

## 2015-03-20 NOTE — Patient Instructions (Signed)
[place] at [facility] Discharge Instructions  RECOMMENDATIONS MADE BY THE CONSULTANT AND ANY TEST RESULTS WILL BE SENT TO YOUR REFERRING PHYSICIAN.  You will receive your first treatment today.   Return in one week as scheduled, on Tuesday 03/27/2015 at 8:50.   Please call us with any questions or concerns before then.    Thank you for choosing [place] at [facility] to provide your oncology and hematology care.  To afford each patient quality time with our provider, please arrive at least 15 minutes before your scheduled appointment time.    You need to re-schedule your appointment should you arrive 10 or more minutes late.  We strive to give you quality time with our providers, and arriving late affects you and other patients whose appointments are after yours.  Also, if you no show three or more times for appointments you may be dismissed from the clinic at the providers discretion.     Again, thank you for choosing [facility].  Our hope is that these requests will decrease the amount of time that you wait before being seen by our physicians.       _____________________________________________________________  Should you have questions after your visit to [facility], please contact our office at [phone] between the hours of 8:30 a.m. and 4:30 p.m.  Voicemails left after 4:30 p.m. will not be returned until the following business day.  For prescription refill requests, have your pharmacy contact our office.

## 2015-03-20 NOTE — Progress Notes (Signed)
[place] Clinical Social Work  Clinical Social Work was referred by patient navigator for assessment of psychosocial needs due to insurance concerns. Clinical Social Worker met with patient and her daughter at [facility] to offer support and assess for needs. CSW introduced self, explained role of CSW and discussed resources to assist. Pt and daughter plan to research options online through [facility] later today. CSW also reviewed resource options to assist; such as Cancer Care and ACS. CSW also answered questions re ss disability and medicaid. Pt currently does not qualify for either. They were appreciative of support and information. They agree to reach out as needed.     Clinical Social Work interventions: Supportive Psychiatric nurse and referral   [place], [place] Tues[place]   [phone]

## 2015-03-20 NOTE — Progress Notes (Signed)
Lab work will be drawn today for Heidi Cooper baseline. Today is first treatment day, will use results from 02/27/15 to start. Tolerated leucovorin infusion well. D/C home with continuous infusion pump intact. Ambulatory on discharge.

## 2015-03-20 NOTE — Patient Instructions (Signed)
[facility] Discharge Instructions for Patients Receiving Chemotherapy  Today you received the following chemotherapy agents Leucovorin and 5FU pump.  To help prevent nausea and vomiting after your treatment, we encourage you to take your nausea medication as instructed. If you develop nausea and vomiting that is not controlled by your nausea medication, call the clinic. If it is after clinic hours your family physician or the after hours number for the clinic or go to the Emergency Department. BELOW ARE SYMPTOMS THAT SHOULD BE REPORTED IMMEDIATELY:  *FEVER GREATER THAN 101.0 F  *CHILLS WITH OR WITHOUT FEVER  NAUSEA AND VOMITING THAT IS NOT CONTROLLED WITH YOUR NAUSEA MEDICATION  *UNUSUAL SHORTNESS OF BREATH  *UNUSUAL BRUISING OR BLEEDING  TENDERNESS IN MOUTH AND THROAT WITH OR WITHOUT PRESENCE OF ULCERS  *URINARY PROBLEMS  *BOWEL PROBLEMS  UNUSUAL RASH Items with * indicate a potential emergency and should be followed up as soon as possible.  Return as scheduled.  I have been informed and understand all the instructions given to me. I know to contact the clinic, my physician, or go to the Emergency Department if any problems should occur. I do not have any questions at this time, but understand that I may call the clinic during office hours or the Patient Navigator at [phone] should I have any questions or need assistance in obtaining follow up care.    __________________________________________  _____________  __________ Signature of Patient or Authorized Representative            Date                   Time    __________________________________________ Nurse's Signature

## 2015-03-21 ENCOUNTER — Encounter (HOSPITAL_BASED_OUTPATIENT_CLINIC_OR_DEPARTMENT_OTHER): Payer: PRIVATE HEALTH INSURANCE

## 2015-03-21 VITALS — BP 116/71 | HR 64 | Temp 98.0°F | Resp 16

## 2015-03-21 DIAGNOSIS — Z452 Encounter for adjustment and management of vascular access device: Secondary | ICD-10-CM

## 2015-03-21 DIAGNOSIS — C160 Malignant neoplasm of cardia: Secondary | ICD-10-CM

## 2015-03-21 DIAGNOSIS — C779 Secondary and unspecified malignant neoplasm of lymph node, unspecified: Secondary | ICD-10-CM

## 2015-03-21 DIAGNOSIS — C163 Malignant neoplasm of pyloric antrum: Secondary | ICD-10-CM

## 2015-03-21 MED ORDER — HEPARIN SOD (PORK) LOCK FLUSH 100 UNIT/ML IV SOLN
INTRAVENOUS | Status: AC
  Filled 2015-03-21: qty 5

## 2015-03-21 MED ORDER — HEPARIN SOD (PORK) LOCK FLUSH 100 UNIT/ML IV SOLN
500.0000 [IU] | Freq: Once | INTRAVENOUS | Status: AC | PRN
  Administered 2015-03-21: 500 [IU]

## 2015-03-21 MED ORDER — SODIUM CHLORIDE 0.9 % IJ SOLN
10.0000 mL | INTRAMUSCULAR | Status: DC | PRN
  Administered 2015-03-21: 10 mL
  Filled 2015-03-21: qty 10

## 2015-03-21 NOTE — Patient Instructions (Signed)
[facility] at [facility] Discharge Instructions  RECOMMENDATIONS MADE BY THE CONSULTANT AND ANY TEST RESULTS WILL BE SENT TO YOUR REFERRING PHYSICIAN.  Return as scheduled.  Thank you for choosing [facility] at [facility] to provide your oncology and hematology care.  To afford each patient quality time with our provider, please arrive at least 15 minutes before your scheduled appointment time.    You need to re-schedule your appointment should you arrive 10 or more minutes late.  We strive to give you quality time with our providers, and arriving late affects you and other patients whose appointments are after yours.  Also, if you no show three or more times for appointments you may be dismissed from the clinic at the providers discretion.     Again, thank you for choosing [facility].  Our hope is that these requests will decrease the amount of time that you wait before being seen by our physicians.       _____________________________________________________________  Should you have questions after your visit to [facility], please contact our office at [phone] between the hours of 8:30 a.m. and 4:30 p.m.  Voicemails left after 4:30 p.m. will not be returned until the following business day.  For prescription refill requests, have your pharmacy contact our office.    

## 2015-03-21 NOTE — Progress Notes (Signed)
D/C continuous infusion pump. Flushed port per protocol. De-accessed port needle. Patient denies any complaints with chemotherapy infusion. Reviewed problems/issues she should report to clinic. Verbalized understanding. Ambulatory on departure.

## 2015-03-22 ENCOUNTER — Other Ambulatory Visit (HOSPITAL_COMMUNITY): Payer: Self-pay | Admitting: Oncology

## 2015-03-27 ENCOUNTER — Encounter (HOSPITAL_COMMUNITY): Payer: Self-pay | Admitting: Hematology & Oncology

## 2015-03-27 ENCOUNTER — Encounter (HOSPITAL_BASED_OUTPATIENT_CLINIC_OR_DEPARTMENT_OTHER): Payer: PRIVATE HEALTH INSURANCE | Admitting: Hematology & Oncology

## 2015-03-27 VITALS — BP 108/65 | HR 64 | Temp 97.6°F | Resp 14 | Wt 100.7 lb

## 2015-03-27 DIAGNOSIS — R634 Abnormal weight loss: Secondary | ICD-10-CM | POA: Diagnosis not present

## 2015-03-27 DIAGNOSIS — C779 Secondary and unspecified malignant neoplasm of lymph node, unspecified: Secondary | ICD-10-CM

## 2015-03-27 DIAGNOSIS — D649 Anemia, unspecified: Secondary | ICD-10-CM

## 2015-03-27 DIAGNOSIS — E46 Unspecified protein-calorie malnutrition: Secondary | ICD-10-CM

## 2015-03-27 DIAGNOSIS — C160 Malignant neoplasm of cardia: Secondary | ICD-10-CM | POA: Diagnosis not present

## 2015-03-27 DIAGNOSIS — D630 Anemia in neoplastic disease: Secondary | ICD-10-CM

## 2015-03-27 NOTE — Progress Notes (Signed)
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 Negative margins at time of surgery, and 2/14 positive lymph nodes for metastatic disease.  HISTORY OF PRESENT ILLNESS:   Heidi Cooper is a 59 year old white female with a past medical history significant for GERD who is referred to [facility] following diagnosis of signet ring cell carcinoma of gastric antrum, clinically Stage IIIA measuring 4.5 cm (1 cm in depth) with invasion into the visceral peritoneum, LVI+. Negative margins at time of surgery, and 2/14 positive lymph nodes for metastatic disease.  Heidi Cooper is feeling well and here alone today. She received her first chemotherapy with 5-FU last week. She denies mouth sores or diarrhea. Noting that she had the opposite of diarrhea instead. She is eating well, without any nausea or vomiting. Recently, she ate a hot dog which "talked back to her a bit" but was fine other than that. She eats a carnation instant breakfast in the mornings, snacking on crackers and yogurt throughout the day. She reports her energy level and strength are better. She continues to work at a [employer] two to three days a week.   She reports her incision site is still a bit sore/tender.  Her insurance will change in January and she will need to switch to another cancer center at that time.  She is scheduled to see radiation on Thursday, 11/10, with Dr. Lisbeth Renshaw at [place]. She is not sure when they will begin radiation therapy.  She has never received a flu shot before and fears she will get the flu if she receives one.     Gastric cancer ([place])   01/21/2015 Imaging CT  abd/pelvis- Gastroptosis with massive enlargement of the stomach, but no acute bowel obstruction. Query severe gastroparesis. See coronal image 33. No other acute or inflammatory process identified in the abdomen or pelvis. Diverticulosis   02/06/2015 - 02/19/2015 Hospital Admission Nausea with vomiting, Gastric outlet obstruction, Malnutrition of moderate degree ([place])   02/06/2015 Procedure EGD by Dr. Oneida Alar   02/06/2015 Pathology Results Stomach, biopsy, pre-pylori - ULCERATED, POORLY DIFFERENTIATED ADENOCARCINOMA   02/09/2015 Definitive Surgery Dr. Arnoldo Morale- partial gastrectomy (distal antrectomy) with Roux-en-Y gastrojejunostomy, right hemicolectomy.   02/09/2015 Pathology Results Stomach, resection for tumor, distal stomach SIGNET RING CELL CARCINOMA (4.5 CM, 1 CM IN DEPTH) TUMOR INVADES VISCERAL PERITONEUM LYMPHOVASCULAR INVASION IDENTIFIED,MARGINS OF RESECTION ARE NEGATIVE FOR TUMOR METASTATIC CARCINOMA IN 2/14   02/09/2015 Pathology Results HER NEGATIVE   02/12/2015 Imaging DG UGI- Patent gastrojejunostomy anastomosis post distal gastrectomy. No evidence of contrast extravasation to suggest anastomotic leak.   03/20/2015 -  Chemotherapy 5FU/Leucovorin days 1, and 15 x 1 cycle.     PAST MEDICAL HISTORY:   Past Medical History  Diagnosis Date  . Migraine   . Gastric cancer ([place]) 02/27/2015    ALLERGIES: Allergies  Allergen Reactions  . Other Itching    Mycins cause itching   . Sulfa Antibiotics Other (See Comments)    Childhood reaction   . Penicillins Rash    Childhood reaction       MEDICATIONS: I have reviewed the patient's current  medications.    Current Outpatient Prescriptions on File Prior to Visit  Medication Sig Dispense Refill  . calcium carbonate (TUMS - DOSED IN MG ELEMENTAL CALCIUM) 500 MG chewable tablet Chew 1 tablet by mouth as needed for indigestion or heartburn.    . calcium citrate-vitamin D (CITRACAL+D) 315-200 MG-UNIT tablet Take 1 tablet by mouth daily.    .  Cholecalciferol (VITAMIN D3) 2000 UNITS TABS Take 1 tablet by mouth daily.    . cyanocobalamin 500 MCG tablet Take 500 mcg by mouth daily.    Marland Kitchen escitalopram (LEXAPRO) 20 MG tablet Take 1/2 tab for 5 days then increase to 1 tab daily. (Patient taking differently: Take 20 mg by mouth daily. ) 30 tablet 3  . fluticasone (FLONASE) 50 MCG/ACT nasal spray Place 2 sprays into the nose daily as needed for allergies.     Marland Kitchen lidocaine-prilocaine (EMLA) cream Apply a quarter size amount to port site 1 hour prior to chemo. Do not rub in. Cover with plastic wrap. 30 g 2  . ondansetron (ZOFRAN ODT) 4 MG disintegrating tablet Take 1 tablet (4 mg total) by mouth every 8 (eight) hours as needed for nausea. 30 tablet 1  . polyethylene glycol (MIRALAX / GLYCOLAX) packet Take 17 g by mouth as needed.    . Sennosides (SENOKOT PO) Take 1 tablet by mouth as needed.    . topiramate (TOPAMAX) 25 MG tablet Take 25 mg by mouth at bedtime.     Marland Kitchen dextrose 5 % SOLN 1,000 mL with fluorouracil 5 GM/100ML SOLN Inject into the vein. To start around first of November tentatively    . HYDROcodone-acetaminophen (NORCO/VICODIN) 5-325 MG tablet Take 1-2 tablets by mouth every 4 (four) hours as needed for moderate pain. (Patient not taking: Reported on 03/20/2015) 50 tablet 0  . loperamide (IMODIUM) 2 MG capsule Take 2 mg by mouth as needed for diarrhea or loose stools.    . potassium chloride (K-DUR) 10 MEQ tablet Take 1 tablet (10 mEq total) by mouth 2 (two) times daily. (Patient not taking: Reported on 03/20/2015) 28 tablet 0  . promethazine (PHENERGAN) 12.5 MG tablet Take 1 tablet (12.5 mg total) by mouth every 6 (six) hours as needed for nausea or vomiting. (Patient not taking: Reported on 03/20/2015) 30 tablet 1   No current facility-administered medications on file prior to visit.     PAST SURGICAL HISTORY Past Surgical History  Procedure Laterality Date  . Tonsillectomy    . Esophagogastroduodenoscopy N/A 02/06/2015    Procedure:  ESOPHAGOGASTRODUODENOSCOPY (EGD);  Surgeon: Danie Binder, MD;  [facility];  Service: Endoscopy;  Laterality: N/A;  . Gastrectomy N/A 02/09/2015    Procedure: DISTAL GASTRECTOMY WITH GASTROJEJUNOSTOMY; RIGHT HEMICOLECTOMY;  Surgeon: Aviva Signs, MD;  Location: [facility] ORS;  Service: General;  Laterality: N/A;  . Portacath placement N/A 03/09/2015    Procedure: INSERTION PORT-A-CATH-left subclavian;  Surgeon: Aviva Signs, MD;  Location: [facility] ORS;  Service: General;  Laterality: N/A;    FAMILY HISTORY: Family History  Problem Relation Age of Onset  . Heart disease Mother   . Heart disease Father   . Colon cancer Neg Hx    She has never been married.  She has no children.  She has two younger sisters ages 37 and 86.  Both are healthy.  One lives in [place] and another is in [place].  Her father passed at a young age of 36 secondary to a MI.  Her mother is deceased at the age of  76 from a MI as well.  She denies any family history of malignancies.  SOCIAL HISTORY:  reports that she quit smoking about 3 months ago. She has never used smokeless tobacco. She reports that she does not drink alcohol or use illicit drugs.  She notes a history of tobacco abuse, smoking 1 ppd x 45 years.  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 She admits to a distant history of marijuana use.  PERFORMANCE STATUS: The patient's performance status is 1 - Symptomatic but completely ambulatory  PHYSICAL EXAM: Most Recent Vital Signs: Blood pressure 108/65, pulse 64, temperature 97.6 F (36.4 C), temperature source Oral, resp. rate 14, weight 100 lb 11.2 oz (45.677 kg), SpO2 100 %. General appearance: alert, cooperative, appears stated age, cachectic, no distress Head: Normocephalic, without obvious abnormality, atraumatic Eyes: negative findings: lids and lashes normal, conjunctivae and sclerae normal and corneas clear Neck: no adenopathy and supple, symmetrical,  trachea midline Lungs: clear to auscultation bilaterally and normal percussion bilaterally Heart: regular rate and rhythm, S1, S2 normal, no murmur, click, rub or gallop Abdomen: soft, non-tender; bowel sounds normal; no masses,  no organomegaly and surgical scar is well healed without any erythema. Extremities: extremities normal, atraumatic, no cyanosis or edema Skin: Skin color, texture, turgor normal. No rashes or lesions Lymph nodes: Cervical, supraclavicular, and axillary nodes normal. Neurologic: Alert and oriented X 3, normal strength and tone. Normal symmetric reflexes. Normal coordination and gait  LABORATORY DATA:  CBC    Component Value Date/Time   WBC 7.1 03/20/2015 0950   RBC 3.26* 03/20/2015 0950   HGB 10.5* 03/20/2015 0950   HCT 31.9* 03/20/2015 0950   PLT 249 03/20/2015 0950   MCV 97.9 03/20/2015 0950   MCH 32.2 03/20/2015 0950   MCHC 32.9 03/20/2015 0950   RDW 13.6 03/20/2015 0950   LYMPHSABS 1.3 03/20/2015 0950   MONOABS 0.4 03/20/2015 0950   EOSABS 0.1 03/20/2015 0950   BASOSABS 0.0 03/20/2015 0950      Chemistry      Component Value Date/Time   NA 141 03/20/2015 0950   K 4.0 03/20/2015 0950   CL 111 03/20/2015 0950   CO2 24 03/20/2015 0950   BUN 16 03/20/2015 0950   CREATININE 0.75 03/20/2015 0950      Component Value Date/Time   CALCIUM 9.0 03/20/2015 0950   ALKPHOS 39 03/20/2015 0950   AST 20 03/20/2015 0950   ALT 14 03/20/2015 0950   BILITOT 0.5 03/20/2015 0950       PATHOLOGY:    Diagnosis 1. Stomach, resection for tumor, distal stomach SIGNET RING CELL CARCINOMA (4.5 CM, 1 CM IN DEPTH) TUMOR INVADES VISCERAL PERITONEUM LYMPHOVASCULAR INVASION IDENTIFIED MARGINS OF RESECTION ARE NEGATIVE FOR TUMOR METASTATIC CARCINOMA IN TWO OUT OFF FOURTEEN LYMPH NODES (2/14) 2. 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 Negative margins at time of surgery, and 2/14 positive lymph nodes for metastatic disease.  She has completed her first treatment with 5-FU. She has done well. She looks good today and seems to be improving physically week to week.  She will return next week for D15 of therapy. She will proceed with concurrent treatment and sees  XRT on Thursday.  Her insurance will change in January and she will need to switch to another cancer center at that time.  She will receive treatment next week as planned. I will see her again before treatment.  All questions were answered. The patient knows to call the clinic with any problems, questions or concerns. We can certainly see the patient much sooner if necessary.  This note is electronically signed by: Molli Hazard, MD  03/27/2015 9:37 AM

## 2015-03-27 NOTE — Patient Instructions (Signed)
[place] at [facility] Discharge Instructions  RECOMMENDATIONS MADE BY THE CONSULTANT AND ANY TEST RESULTS WILL BE SENT TO YOUR REFERRING PHYSICIAN.  Exam and discussion by Dr. Whitney Muse. You are doing well Report fevers, uncontrolled nausea, vomiting, mouth sores or other concerns.  Follow-up in 1 week.  Thank you for choosing [place] at [facility] to provide your oncology and hematology care.  To afford each patient quality time with our provider, please arrive at least 15 minutes before your scheduled appointment time.    You need to re-schedule your appointment should you arrive 10 or more minutes late.  We strive to give you quality time with our providers, and arriving late affects you and other patients whose appointments are after yours.  Also, if you no show three or more times for appointments you may be dismissed from the clinic at the providers discretion.     Again, thank you for choosing [facility].  Our hope is that these requests will decrease the amount of time that you wait before being seen by our physicians.       _____________________________________________________________  Should you have questions after your visit to [facility], please contact our office at [phone] between the hours of 8:30 a.m. and 4:30 p.m.  Voicemails left after 4:30 p.m. will not be returned until the following business day.  For prescription refill requests, have your pharmacy contact our office.

## 2015-03-29 ENCOUNTER — Other Ambulatory Visit (HOSPITAL_COMMUNITY): Payer: Self-pay | Admitting: *Deleted

## 2015-04-01 NOTE — Progress Notes (Signed)
Heidi Cooper, [place] [address]  Malignant neoplasm of cardia of stomach ([place])  CURRENT THERAPY: Undergoing first cycle of 5FU/Leucovorin day 1 and 15.  INTERVAL HISTORY: Heidi Cooper 59 y.o. 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 Negative margins at time of surgery, and 2/14 positive lymph nodes for metastatic disease.    Gastric cancer ([place])   01/21/2015 Imaging CT abd/pelvis- Gastroptosis with massive enlargement of the stomach, but no acute bowel obstruction. Query severe gastroparesis. See coronal image 33. No other acute or inflammatory process identified in the abdomen or pelvis. Diverticulosis   02/06/2015 - 02/19/2015 Hospital Admission Nausea with vomiting, Gastric outlet obstruction, Malnutrition of moderate degree ([facility])   02/06/2015 Procedure EGD by Dr. Oneida Alar   02/06/2015 Pathology Results Stomach, biopsy, pre-pylori - ULCERATED, POORLY DIFFERENTIATED ADENOCARCINOMA   02/09/2015 Definitive Surgery Dr. Arnoldo Morale- partial gastrectomy (distal antrectomy) with Roux-en-Y gastrojejunostomy, right hemicolectomy.   02/09/2015 Pathology Results Stomach, resection for tumor, distal stomach SIGNET RING CELL CARCINOMA (4.5 CM, 1 CM IN DEPTH) TUMOR INVADES VISCERAL PERITONEUM LYMPHOVASCULAR INVASION IDENTIFIED,MARGINS OF RESECTION ARE NEGATIVE FOR TUMOR METASTATIC CARCINOMA IN 2/14   02/09/2015 Pathology Results HER NEGATIVE   02/12/2015 Imaging DG UGI- Patent gastrojejunostomy anastomosis post distal gastrectomy. No evidence of contrast extravasation to suggest anastomotic leak.   03/20/2015 -  Chemotherapy 5FU/Leucovorin days 1, and 15 x 1 cycle.    I personally reviewed and went over laboratory results with the patient.  The results are noted within this dictation.  Labs today meet treatment parameters.  She reports that she  feels good.  She notes some minor constipation that is well controlled at home.  She denies any loose stools.  She denies any mouth sores as well.  She has seen Rad Onc and planning for concurrent XRT + Chemo is underway and scheduled.   She denies any complaints. Clinically, she looks much improved compared to her consultation day.  She cannot afford enrollment in the [facility] marketplace.   Past Medical History  Diagnosis Date  . Migraine   . Gastric cancer ([place]) 02/27/2015    has Abnormal weight loss; Abdominal pain, epigastric; Nausea with vomiting; RLQ abdominal mass; Gastric outlet obstruction; Malnutrition of moderate degree ([place]); GERD (gastroesophageal reflux disease); Hypokalemia; Chronic migraine; and Gastric cancer ([place]) on her problem list.     is allergic to other; sulfa antibiotics; and penicillins.  Current Outpatient Prescriptions on File Prior to Visit  Medication Sig Dispense Refill  . calcium carbonate (TUMS - DOSED IN MG ELEMENTAL CALCIUM) 500 MG chewable tablet Chew 1 tablet by mouth as needed for indigestion or heartburn.    . calcium citrate-vitamin D (CITRACAL+D) 315-200 MG-UNIT tablet Take 1 tablet by mouth daily.    . Cholecalciferol (VITAMIN D3) 2000 UNITS TABS Take 1 tablet by mouth daily.    . cyanocobalamin 500 MCG tablet Take 500 mcg by mouth daily.    Marland Kitchen dextrose 5 % SOLN 1,000 mL with fluorouracil 5 GM/100ML SOLN Inject into the vein. To start around first of November tentatively    . escitalopram (LEXAPRO) 20 MG tablet Take 1/2 tab for 5 days then increase to 1 tab daily. (Patient taking differently: Take 20 mg by mouth daily. ) 30 tablet 3  . fluticasone (FLONASE) 50 MCG/ACT nasal spray Place 2 sprays into the nose daily as needed  for allergies.     Marland Kitchen lidocaine-prilocaine (EMLA) cream Apply a quarter size amount to port site 1 hour prior to chemo. Do not rub in. Cover with plastic wrap. 30 g 2  . ondansetron (ZOFRAN ODT) 4 MG disintegrating tablet Take 1  tablet (4 mg total) by mouth every 8 (eight) hours as needed for nausea. 30 tablet 1  . polyethylene glycol (MIRALAX / GLYCOLAX) packet Take 17 g by mouth as needed.    . topiramate (TOPAMAX) 25 MG tablet Take 25 mg by mouth at bedtime.     Marland Kitchen HYDROcodone-acetaminophen (NORCO/VICODIN) 5-325 MG tablet Take 1-2 tablets by mouth every 4 (four) hours as needed for moderate pain. (Patient not taking: Reported on 03/20/2015) 50 tablet 0  . loperamide (IMODIUM) 2 MG capsule Take 2 mg by mouth as needed for diarrhea or loose stools.    . potassium chloride (K-DUR) 10 MEQ tablet Take 1 tablet (10 mEq total) by mouth 2 (two) times daily. (Patient not taking: Reported on 03/20/2015) 28 tablet 0  . promethazine (PHENERGAN) 12.5 MG tablet Take 1 tablet (12.5 mg total) by mouth every 6 (six) hours as needed for nausea or vomiting. (Patient not taking: Reported on 03/20/2015) 30 tablet 1  . Sennosides (SENOKOT PO) Take 1 tablet by mouth as needed.     No current facility-administered medications on file prior to visit.    Past Surgical History  Procedure Laterality Date  . Tonsillectomy    . Esophagogastroduodenoscopy N/A 02/06/2015    Procedure: ESOPHAGOGASTRODUODENOSCOPY (EGD);  Surgeon: Danie Binder, MD;  Location: [facility] END[facility] SUITE;  Service: Endoscopy;  Laterality: N/A;  . Gastrectomy N/A 02/09/2015    Procedure: DISTAL GASTRECTOMY WITH GASTROJEJUNOSTOMY; RIGHT HEMICOLECTOMY;  Surgeon: Aviva Signs, MD;  Location: [facility] ORS;  Service: General;  Laterality: N/A;  . Portacath placement N/A 03/09/2015    Procedure: INSERTION PORT-A-CATH-left subclavian;  Surgeon: Aviva Signs, MD;  Location: [facility] ORS;  Service: General;  Laterality: N/A;    Denies any headaches, dizziness, double vision, fevers, chills, night sweats, nausea, vomiting, diarrhea, constipation, chest pain, heart palpitations, shortness of breath, blood in stool, black tarry stool, urinary pain, urinary burning, urinary frequency, hematuria.   PHYSICAL  EXAMINATION  ECOG PERFORMANCE STATUS: 1 - Symptomatic but completely ambulatory  Filed Vitals:   04/03/15 0914  BP: 132/62  Pulse: 66  Temp: 97.6 F (36.4 C)  Resp: 18    GENERAL:alert, no distress, cachectic, comfortable, cooperative, smiling and in a chemo-recliner, sister is on her way, but not present. SKIN: skin color, texture, turgor are normal, no rashes or significant lesions HEAD: Normocephalic, No masses, lesions, tenderness or abnormalities EYES: normal, PERRLA, EOMI, Conjunctiva are pink and non-injected EARS: External ears normal OROPHARYNX:lips, buccal mucosa, and tongue normal and mucous membranes are moist  NECK: supple, trachea midline LYMPH:  not examined BREAST:not examined LUNGS: clear to auscultation  HEART: regular rate & rhythm ABDOMEN:abdomen soft and non-tender BACK: No CVA tenderness EXTREMITIES:less then 2 second capillary refill, no joint deformities, effusion, or inflammation, no skin discoloration, no cyanosis  NEURO: alert & oriented x 3 with fluent speech, no focal motor/sensory deficits   LABORATORY DATA: CBC    Component Value Date/Time   WBC 7.3 04/03/2015 0908   RBC 3.60* 04/03/2015 0908   HGB 11.7* 04/03/2015 0908   HCT 35.1* 04/03/2015 0908   PLT 195 04/03/2015 0908   MCV 97.5 04/03/2015 0908   MCH 32.5 04/03/2015 0908   MCHC 33.3 04/03/2015 0908   RDW 13.4  04/03/2015 0908   LYMPHSABS 1.6 04/03/2015 0908   MONOABS 0.4 04/03/2015 0908   EOSABS 0.1 04/03/2015 0908   BASOSABS 0.0 04/03/2015 0908      Chemistry      Component Value Date/Time   NA 140 04/03/2015 0908   K 4.1 04/03/2015 0908   CL 111 04/03/2015 0908   CO2 24 04/03/2015 0908   BUN 18 04/03/2015 0908   CREATININE 0.79 04/03/2015 0908      Component Value Date/Time   CALCIUM 9.3 04/03/2015 0908   ALKPHOS 42 04/03/2015 0908   AST 20 04/03/2015 0908   ALT 16 04/03/2015 0908   BILITOT 0.4 04/03/2015 0908        PENDING LABS:   RADIOGRAPHIC  STUDIES:  Ct Chest W Contrast  03/05/2015  CLINICAL DATA:  Staging for gastric antral pyloric cancer (signet ring cell carcinoma of the antrum with invasion of the visceral peri a.m.). Two out of 14 lymph nodes were positive for metastatic disease. Surgery in September 2016. EXAM: CT CHEST, ABDOMEN, AND PELVIS WITH CONTRAST TECHNIQUE: Multidetector CT imaging of the chest, abdomen and pelvis was performed following the standard protocol during bolus administration of intravenous contrast. CONTRAST:  173m OMNIPAQUE IOHEXOL 300 MG/ML  SOLN COMPARISON:  None. FINDINGS: CT CHEST FINDINGS Mediastinum/Nodes: Contrast medium in the esophagus compatible with dysmotility or reflux. Small mediastinal lymph nodes are not pathologically enlarged by size criteria. Lungs/Pleura: Biapical pleural-parenchymal scarring appear symmetric. No findings of metastatic disease to the lungs. Musculoskeletal: Mild thoracic spondylosis. CT ABDOMEN PELVIS FINDINGS Hepatobiliary: Borderline gallbladder wall thickening. Pancreas: Unremarkable Spleen: Unremarkable Adrenals/Urinary Tract: Unremarkable Stomach/Bowel: Partial gastrectomy. Stranding in the adjacent upper omentum as on image 79 of series 2 merits observation although may simply be postoperative. Gastrojejunostomy widely patent. Right hemicolectomy. Sigmoid diverticulosis. Vascular/Lymphatic: Minimal abdominal aortic atherosclerosis. No individually identifiable pathologically enlarged lymph nodes. Reproductive: Calcified uterine lesions including dense calcifications in a 2.6 cm presumed fibroid of the uterine fundus. Other: Stranding in the upper omentum as noted above, quite possibly postoperative but will merit observation. This is shown for example on image 24 series 3 and image 81 series 2. Musculoskeletal: Levoconvex lumbar rotary scoliosis. IMPRESSION: 1. Postoperative for gastrojejunostomy and right hemicolectomy. No specific findings of residual or recurrent tumor,  although there is some stranding in the upper omentum which may be postoperative but which warrants observation. 2. Borderline gallbladder wall thickening. 3. Calcified uterine fibroids. 4. Levoconvex lumbar rotary scoliosis. 5. Sigmoid diverticulosis. Electronically Signed   By: WVan ClinesM.D.   On: 03/05/2015 14:36   Ct Abdomen Pelvis W Contrast  03/05/2015  CLINICAL DATA:  Staging for gastric antral pyloric cancer (signet ring cell carcinoma of the antrum with invasion of the visceral peri a.m.). Two out of 14 lymph nodes were positive for metastatic disease. Surgery in September 2016. EXAM: CT CHEST, ABDOMEN, AND PELVIS WITH CONTRAST TECHNIQUE: Multidetector CT imaging of the chest, abdomen and pelvis was performed following the standard protocol during bolus administration of intravenous contrast. CONTRAST:  1058mOMNIPAQUE IOHEXOL 300 MG/ML  SOLN COMPARISON:  None. FINDINGS: CT CHEST FINDINGS Mediastinum/Nodes: Contrast medium in the esophagus compatible with dysmotility or reflux. Small mediastinal lymph nodes are not pathologically enlarged by size criteria. Lungs/Pleura: Biapical pleural-parenchymal scarring appear symmetric. No findings of metastatic disease to the lungs. Musculoskeletal: Mild thoracic spondylosis. CT ABDOMEN PELVIS FINDINGS Hepatobiliary: Borderline gallbladder wall thickening. Pancreas: Unremarkable Spleen: Unremarkable Adrenals/Urinary Tract: Unremarkable Stomach/Bowel: Partial gastrectomy. Stranding in the adjacent upper omentum as on image 79 of  series 2 merits observation although may simply be postoperative. Gastrojejunostomy widely patent. Right hemicolectomy. Sigmoid diverticulosis. Vascular/Lymphatic: Minimal abdominal aortic atherosclerosis. No individually identifiable pathologically enlarged lymph nodes. Reproductive: Calcified uterine lesions including dense calcifications in a 2.6 cm presumed fibroid of the uterine fundus. Other: Stranding in the upper omentum  as noted above, quite possibly postoperative but will merit observation. This is shown for example on image 24 series 3 and image 81 series 2. Musculoskeletal: Levoconvex lumbar rotary scoliosis. IMPRESSION: 1. Postoperative for gastrojejunostomy and right hemicolectomy. No specific findings of residual or recurrent tumor, although there is some stranding in the upper omentum which may be postoperative but which warrants observation. 2. Borderline gallbladder wall thickening. 3. Calcified uterine fibroids. 4. Levoconvex lumbar rotary scoliosis. 5. Sigmoid diverticulosis. Electronically Signed   By: Van Clines M.D.   On: 03/05/2015 14:36   Dg Chest Port 1 View  03/09/2015  CLINICAL DATA:  Port-A-Cath placement. EXAM: DG C-ARM 61-120 MIN; PORTABLE CHEST - 1 VIEW COMPARISON:  Chest x-ray 02/08/2015 FINDINGS: The cardiac silhouette, mediastinal and hilar contours are normal. The lungs are clear. The left subclavian power port tip is in the mid SVC. No complicating features. IMPRESSION: Left subclavian power port tip is in the mid SVC. No complicating features. Electronically Signed   By: Marijo Sanes M.D.   On: 03/09/2015 10:13   Dg C-arm 1-60 Min  03/09/2015  CLINICAL DATA:  Port-A-Cath placement. EXAM: DG C-ARM 61-120 MIN; PORTABLE CHEST - 1 VIEW COMPARISON:  Chest x-ray 02/08/2015 FINDINGS: The cardiac silhouette, mediastinal and hilar contours are normal. The lungs are clear. The left subclavian power port tip is in the mid SVC. No complicating features. IMPRESSION: Left subclavian power port tip is in the mid SVC. No complicating features. Electronically Signed   By: Marijo Sanes M.D.   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 Negative margins at time of surgery, and 2/14 positive lymph nodes for metastatic  disease.  Oncology history is up-to-date.  Pre-chemo labs today  Day 15 of treatment today.  She has met with Rad Onc, Dr. Lisbeth Renshaw on Thursday, 03/29/2015.  Following her first cycle of chemotherapy, she will transition to concomitant chemoradiation with 5FU.  Return in 1-2 weeks for follow-up and to embark on next treatment.    THERAPY PLAN:  Start Day 15 of cycle 1 today as planned.  She canno afford ACA and therefore, she will need to transition her oncology care by the 1st of the new year (2017).  All questions were answered. The patient knows to call the clinic with any problems, questions or concerns. We can certainly see the patient much sooner if necessary.  Patient and plan discussed with Dr. Ancil Linsey and she is in agreement with the aforementioned.   This note is electronically signed by: Doy Mince 04/03/2015 9:52 AM

## 2015-04-01 NOTE — Assessment & Plan Note (Addendum)
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 Negative margins at time of surgery, and 2/14 positive lymph nodes for metastatic disease.  Oncology history is up-to-date.  Pre-chemo labs today  Day 15 of treatment today.  She has met with Rad Onc, Dr. Lisbeth Renshaw on Thursday, 03/29/2015.  Following her first cycle of chemotherapy, she will transition to concomitant chemoradiation with 5FU.  Return in 1 week for follow-up and to embark on next treatment.

## 2015-04-03 ENCOUNTER — Encounter (HOSPITAL_BASED_OUTPATIENT_CLINIC_OR_DEPARTMENT_OTHER): Payer: PRIVATE HEALTH INSURANCE | Admitting: Oncology

## 2015-04-03 ENCOUNTER — Encounter (HOSPITAL_BASED_OUTPATIENT_CLINIC_OR_DEPARTMENT_OTHER): Payer: PRIVATE HEALTH INSURANCE

## 2015-04-03 ENCOUNTER — Encounter (HOSPITAL_COMMUNITY): Payer: Self-pay | Admitting: Oncology

## 2015-04-03 ENCOUNTER — Telehealth (HOSPITAL_COMMUNITY): Payer: Self-pay | Admitting: Hematology & Oncology

## 2015-04-03 VITALS — BP 132/62 | HR 66 | Temp 97.6°F | Resp 18 | Wt 99.0 lb

## 2015-04-03 DIAGNOSIS — Z23 Encounter for immunization: Secondary | ICD-10-CM | POA: Diagnosis not present

## 2015-04-03 DIAGNOSIS — C163 Malignant neoplasm of pyloric antrum: Secondary | ICD-10-CM | POA: Diagnosis not present

## 2015-04-03 DIAGNOSIS — Z5111 Encounter for antineoplastic chemotherapy: Secondary | ICD-10-CM | POA: Diagnosis not present

## 2015-04-03 DIAGNOSIS — C160 Malignant neoplasm of cardia: Secondary | ICD-10-CM | POA: Diagnosis not present

## 2015-04-03 DIAGNOSIS — C779 Secondary and unspecified malignant neoplasm of lymph node, unspecified: Secondary | ICD-10-CM

## 2015-04-03 LAB — COMPREHENSIVE METABOLIC PANEL
ALK PHOS: 42 U/L (ref 38–126)
ALT: 16 U/L (ref 14–54)
AST: 20 U/L (ref 15–41)
Albumin: 4.1 g/dL (ref 3.5–5.0)
Anion gap: 5 (ref 5–15)
BILIRUBIN TOTAL: 0.4 mg/dL (ref 0.3–1.2)
BUN: 18 mg/dL (ref 6–20)
CALCIUM: 9.3 mg/dL (ref 8.9–10.3)
CHLORIDE: 111 mmol/L (ref 101–111)
CO2: 24 mmol/L (ref 22–32)
CREATININE: 0.79 mg/dL (ref 0.44–1.00)
Glucose, Bld: 79 mg/dL (ref 65–99)
Potassium: 4.1 mmol/L (ref 3.5–5.1)
Sodium: 140 mmol/L (ref 135–145)
Total Protein: 6.5 g/dL (ref 6.5–8.1)

## 2015-04-03 LAB — CBC WITH DIFFERENTIAL/PLATELET
BASOS ABS: 0 10*3/uL (ref 0.0–0.1)
Basophils Relative: 1 %
Eosinophils Absolute: 0.1 10*3/uL (ref 0.0–0.7)
Eosinophils Relative: 2 %
HEMATOCRIT: 35.1 % — AB (ref 36.0–46.0)
HEMOGLOBIN: 11.7 g/dL — AB (ref 12.0–15.0)
LYMPHS ABS: 1.6 10*3/uL (ref 0.7–4.0)
LYMPHS PCT: 21 %
MCH: 32.5 pg (ref 26.0–34.0)
MCHC: 33.3 g/dL (ref 30.0–36.0)
MCV: 97.5 fL (ref 78.0–100.0)
Monocytes Absolute: 0.4 10*3/uL (ref 0.1–1.0)
Monocytes Relative: 6 %
NEUTROS ABS: 5.2 10*3/uL (ref 1.7–7.7)
NEUTROS PCT: 70 %
PLATELETS: 195 10*3/uL (ref 150–400)
RBC: 3.6 MIL/uL — AB (ref 3.87–5.11)
RDW: 13.4 % (ref 11.5–15.5)
WBC: 7.3 10*3/uL (ref 4.0–10.5)

## 2015-04-03 MED ORDER — LEUCOVORIN CALCIUM INJECTION 350 MG
400.0000 mg/m2 | Freq: Once | INTRAVENOUS | Status: AC
  Administered 2015-04-03: 560 mg via INTRAVENOUS
  Filled 2015-04-03: qty 28

## 2015-04-03 MED ORDER — SODIUM CHLORIDE 0.9 % IV SOLN
Freq: Once | INTRAVENOUS | Status: AC
  Administered 2015-04-03: 10:00:00 via INTRAVENOUS

## 2015-04-03 MED ORDER — INFLUENZA VAC SPLIT QUAD 0.5 ML IM SUSY
PREFILLED_SYRINGE | INTRAMUSCULAR | Status: AC
  Filled 2015-04-03: qty 0.5

## 2015-04-03 MED ORDER — SODIUM CHLORIDE 0.9 % IV SOLN
600.0000 mg/m2 | INTRAVENOUS | Status: DC
  Administered 2015-04-03: 850 mg via INTRAVENOUS
  Filled 2015-04-03: qty 17

## 2015-04-03 MED ORDER — PALONOSETRON HCL INJECTION 0.25 MG/5ML
0.2500 mg | Freq: Once | INTRAVENOUS | Status: AC
Start: 2015-04-03 — End: 2015-04-03
  Administered 2015-04-03: 0.25 mg via INTRAVENOUS
  Filled 2015-04-03: qty 5

## 2015-04-03 MED ORDER — SODIUM CHLORIDE 0.9 % IV SOLN
12.0000 mg | Freq: Once | INTRAVENOUS | Status: AC
  Administered 2015-04-03: 12 mg via INTRAVENOUS
  Filled 2015-04-03: qty 1.2

## 2015-04-03 MED ORDER — INFLUENZA VAC SPLIT QUAD 0.5 ML IM SUSY
0.5000 mL | PREFILLED_SYRINGE | Freq: Once | INTRAMUSCULAR | Status: AC
  Administered 2015-04-03: 0.5 mL via INTRAMUSCULAR

## 2015-04-03 MED ORDER — FLUOROURACIL CHEMO INJECTION 2.5 GM/50ML
400.0000 mg/m2 | Freq: Once | INTRAVENOUS | Status: AC
  Administered 2015-04-03: 550 mg via INTRAVENOUS
  Filled 2015-04-03: qty 11

## 2015-04-03 MED ORDER — SODIUM CHLORIDE 0.9 % IJ SOLN
10.0000 mL | INTRAMUSCULAR | Status: DC | PRN
  Administered 2015-04-03: 10 mL
  Filled 2015-04-03: qty 10

## 2015-04-03 NOTE — Telephone Encounter (Signed)
Heidi Cooper [id number] APPROVED 03/20/15-09/15/15. OTHER CHEMO DRUGS DID NOT REQUIRE AUTH [place] [id number]

## 2015-04-03 NOTE — Patient Instructions (Signed)
[place] at [facility] Discharge Instructions  RECOMMENDATIONS MADE BY THE CONSULTANT AND ANY TEST RESULTS WILL BE SENT TO YOUR REFERRING PHYSICIAN.  Exam and discussion by Robynn Pane, PA-C Will treat today. Report fevers uncontrolled nausea, vomiting, diarrhea or other concerns.  Follow-up in 1 week.   Thank you for choosing [place] at [facility] to provide your oncology and hematology care.  To afford each patient quality time with our provider, please arrive at least 15 minutes before your scheduled appointment time.    You need to re-schedule your appointment should you arrive 10 or more minutes late.  We strive to give you quality time with our providers, and arriving late affects you and other patients whose appointments are after yours.  Also, if you no show three or more times for appointments you may be dismissed from the clinic at the providers discretion.     Again, thank you for choosing [facility].  Our hope is that these requests will decrease the amount of time that you wait before being seen by our physicians.       _____________________________________________________________  Should you have questions after your visit to [facility], please contact our office at [phone] between the hours of 8:30 a.m. and 4:30 p.m.  Voicemails left after 4:30 p.m. will not be returned until the following business day.  For prescription refill requests, have your pharmacy contact our office.

## 2015-04-03 NOTE — Progress Notes (Signed)
Tolerated chemo well. ambulatory on discharge with continuous infusion pump intact.

## 2015-04-04 ENCOUNTER — Encounter (HOSPITAL_BASED_OUTPATIENT_CLINIC_OR_DEPARTMENT_OTHER): Payer: PRIVATE HEALTH INSURANCE

## 2015-04-04 VITALS — BP 111/56 | HR 65 | Temp 98.1°F | Resp 16

## 2015-04-04 DIAGNOSIS — Z452 Encounter for adjustment and management of vascular access device: Secondary | ICD-10-CM

## 2015-04-04 DIAGNOSIS — C160 Malignant neoplasm of cardia: Secondary | ICD-10-CM

## 2015-04-04 MED ORDER — HEPARIN SOD (PORK) LOCK FLUSH 100 UNIT/ML IV SOLN
500.0000 [IU] | Freq: Once | INTRAVENOUS | Status: AC | PRN
  Administered 2015-04-04: 500 [IU]

## 2015-04-04 MED ORDER — SODIUM CHLORIDE 0.9 % IJ SOLN
10.0000 mL | INTRAMUSCULAR | Status: DC | PRN
  Administered 2015-04-04: 10 mL
  Filled 2015-04-04: qty 10

## 2015-04-04 MED ORDER — HEPARIN SOD (PORK) LOCK FLUSH 100 UNIT/ML IV SOLN
INTRAVENOUS | Status: AC
  Filled 2015-04-04: qty 5

## 2015-04-04 NOTE — Patient Instructions (Signed)
[facility] at [facility] Discharge Instructions  RECOMMENDATIONS MADE BY THE CONSULTANT AND ANY TEST RESULTS WILL BE SENT TO YOUR REFERRING PHYSICIAN.  Return as scheduled.  Thank you for choosing [facility] at [facility] to provide your oncology and hematology care.  To afford each patient quality time with our provider, please arrive at least 15 minutes before your scheduled appointment time.    You need to re-schedule your appointment should you arrive 10 or more minutes late.  We strive to give you quality time with our providers, and arriving late affects you and other patients whose appointments are after yours.  Also, if you no show three or more times for appointments you may be dismissed from the clinic at the providers discretion.     Again, thank you for choosing [facility].  Our hope is that these requests will decrease the amount of time that you wait before being seen by our physicians.       _____________________________________________________________  Should you have questions after your visit to [facility], please contact our office at [phone] between the hours of 8:30 a.m. and 4:30 p.m.  Voicemails left after 4:30 p.m. will not be returned until the following business day.  For prescription refill requests, have your pharmacy contact our office.    

## 2015-04-04 NOTE — Progress Notes (Signed)
Patient denies any complaints post chemo. Discontinued continuous infusion pump. Flushed port per protocol. De-accessed port needle. Patient independantly ambulatory when leaving.

## 2015-04-05 ENCOUNTER — Telehealth (HOSPITAL_COMMUNITY): Payer: Self-pay | Admitting: Hematology & Oncology

## 2015-04-05 ENCOUNTER — Other Ambulatory Visit (HOSPITAL_COMMUNITY): Payer: Self-pay | Admitting: *Deleted

## 2015-04-05 NOTE — Telephone Encounter (Signed)
ALOXI APPROVED BY [facility] EVERY 7 DAYS 6500 MCG DURING APPROVAL PERIOD OF 180 DAYS. MAY VARY BUT SHOULD NOT EXCEED THE MAX ALLOWED.

## 2015-04-09 ENCOUNTER — Encounter (HOSPITAL_COMMUNITY): Payer: Self-pay

## 2015-04-09 ENCOUNTER — Encounter (HOSPITAL_BASED_OUTPATIENT_CLINIC_OR_DEPARTMENT_OTHER): Payer: PRIVATE HEALTH INSURANCE

## 2015-04-09 ENCOUNTER — Ambulatory Visit (HOSPITAL_COMMUNITY): Payer: PRIVATE HEALTH INSURANCE | Admitting: Hematology & Oncology

## 2015-04-09 VITALS — BP 138/49 | HR 60 | Temp 97.7°F | Resp 16 | Wt 100.0 lb

## 2015-04-09 DIAGNOSIS — C779 Secondary and unspecified malignant neoplasm of lymph node, unspecified: Secondary | ICD-10-CM | POA: Diagnosis not present

## 2015-04-09 DIAGNOSIS — R112 Nausea with vomiting, unspecified: Secondary | ICD-10-CM

## 2015-04-09 DIAGNOSIS — C163 Malignant neoplasm of pyloric antrum: Secondary | ICD-10-CM | POA: Diagnosis not present

## 2015-04-09 DIAGNOSIS — C160 Malignant neoplasm of cardia: Secondary | ICD-10-CM | POA: Diagnosis not present

## 2015-04-09 DIAGNOSIS — Z5111 Encounter for antineoplastic chemotherapy: Secondary | ICD-10-CM | POA: Diagnosis not present

## 2015-04-09 DIAGNOSIS — C169 Malignant neoplasm of stomach, unspecified: Secondary | ICD-10-CM

## 2015-04-09 LAB — COMPREHENSIVE METABOLIC PANEL
ALBUMIN: 3.9 g/dL (ref 3.5–5.0)
ALT: 18 U/L (ref 14–54)
ANION GAP: 4 — AB (ref 5–15)
AST: 21 U/L (ref 15–41)
Alkaline Phosphatase: 48 U/L (ref 38–126)
BUN: 16 mg/dL (ref 6–20)
CHLORIDE: 110 mmol/L (ref 101–111)
CO2: 24 mmol/L (ref 22–32)
Calcium: 8.8 mg/dL — ABNORMAL LOW (ref 8.9–10.3)
Creatinine, Ser: 0.74 mg/dL (ref 0.44–1.00)
GFR calc Af Amer: >60 mL/min (ref >60)
GFR calc non Af Amer: >60 mL/min (ref >60)
GLUCOSE: 129 mg/dL — AB (ref 65–99)
POTASSIUM: 3.7 mmol/L (ref 3.5–5.1)
SODIUM: 138 mmol/L (ref 135–145)
TOTAL PROTEIN: 6.3 g/dL — AB (ref 6.5–8.1)
Total Bilirubin: 0.3 mg/dL (ref 0.3–1.2)

## 2015-04-09 LAB — CBC WITH DIFFERENTIAL/PLATELET
BASOS ABS: 0 10*3/uL (ref 0.0–0.1)
BASOS PCT: 1 %
EOS ABS: 0.3 10*3/uL (ref 0.0–0.7)
Eosinophils Relative: 7 %
HCT: 33.3 % — ABNORMAL LOW (ref 36.0–46.0)
Hemoglobin: 11.1 g/dL — ABNORMAL LOW (ref 12.0–15.0)
Lymphocytes Relative: 46 %
Lymphs Abs: 2.2 10*3/uL (ref 0.7–4.0)
MCH: 32.4 pg (ref 26.0–34.0)
MCHC: 33.3 g/dL (ref 30.0–36.0)
MCV: 97.1 fL (ref 78.0–100.0)
MONO ABS: 0.2 10*3/uL (ref 0.1–1.0)
MONOS PCT: 4 %
NEUTROS PCT: 42 %
Neutro Abs: 2 10*3/uL (ref 1.7–7.7)
PLATELETS: 146 10*3/uL — AB (ref 150–400)
RBC: 3.43 MIL/uL — ABNORMAL LOW (ref 3.87–5.11)
RDW: 13.3 % (ref 11.5–15.5)
WBC: 4.8 10*3/uL (ref 4.0–10.5)

## 2015-04-09 MED ORDER — SODIUM CHLORIDE 0.9 % IV SOLN
Freq: Once | INTRAVENOUS | Status: AC
  Administered 2015-04-09: 13:00:00 via INTRAVENOUS

## 2015-04-09 MED ORDER — PALONOSETRON HCL INJECTION 0.25 MG/5ML
0.2500 mg | Freq: Once | INTRAVENOUS | Status: AC
  Administered 2015-04-09: 0.25 mg via INTRAVENOUS
  Filled 2015-04-09: qty 5

## 2015-04-09 MED ORDER — ONDANSETRON 8 MG PO TBDP: 8.0000 mg | ORAL_TABLET | Freq: Three times a day (TID) | ORAL | Status: AC | PRN

## 2015-04-09 MED ORDER — SODIUM CHLORIDE 0.9 % IJ SOLN
10.0000 mL | INTRAMUSCULAR | Status: DC | PRN
Start: 2015-04-09 — End: 2015-04-09
  Administered 2015-04-09: 10 mL
  Filled 2015-04-09: qty 10

## 2015-04-09 MED ORDER — SODIUM CHLORIDE 0.9 % IV SOLN
12.0000 mg | Freq: Once | INTRAVENOUS | Status: AC
  Administered 2015-04-09: 12 mg via INTRAVENOUS
  Filled 2015-04-09: qty 1.2

## 2015-04-09 MED ORDER — SODIUM CHLORIDE 0.9 % IV SOLN
250.0000 mg/m2/d | INTRAVENOUS | Status: DC
  Administered 2015-04-09: 1750 mg via INTRAVENOUS
  Filled 2015-04-09: qty 35

## 2015-04-09 NOTE — Patient Instructions (Signed)
[facility] Discharge Instructions for Patients Receiving Chemotherapy  Today you received the following chemotherapy agents:  5FU  Return at 2:30 PM on Friday, 04/09/15 for pump removal.  If you develop nausea and vomiting, or diarrhea that is not controlled by your medication, call the clinic.  The clinic phone number is [phone]. Office hours are Monday-Friday 8:30am-5:00pm.  BELOW ARE SYMPTOMS THAT SHOULD BE REPORTED IMMEDIATELY:  *FEVER GREATER THAN 101.0 F  *CHILLS WITH OR WITHOUT FEVER  NAUSEA AND VOMITING THAT IS NOT CONTROLLED WITH YOUR NAUSEA MEDICATION  *UNUSUAL SHORTNESS OF BREATH  *UNUSUAL BRUISING OR BLEEDING  TENDERNESS IN MOUTH AND THROAT WITH OR WITHOUT PRESENCE OF ULCERS  *URINARY PROBLEMS  *BOWEL PROBLEMS  UNUSUAL RASH Items with * indicate a potential emergency and should be followed up as soon as possible. If you have an emergency after office hours please contact your primary care physician or go to the nearest emergency department.  Please call the clinic during office hours if you have any questions or concerns.   You may also contact the Patient Navigator at [phone] should you have any questions or need assistance in obtaining follow up care.

## 2015-04-13 ENCOUNTER — Encounter (HOSPITAL_COMMUNITY): Payer: Self-pay

## 2015-04-13 ENCOUNTER — Encounter (HOSPITAL_BASED_OUTPATIENT_CLINIC_OR_DEPARTMENT_OTHER): Payer: PRIVATE HEALTH INSURANCE

## 2015-04-13 VITALS — BP 94/55 | HR 63 | Temp 97.5°F | Resp 18

## 2015-04-13 DIAGNOSIS — C169 Malignant neoplasm of stomach, unspecified: Secondary | ICD-10-CM

## 2015-04-13 DIAGNOSIS — Z452 Encounter for adjustment and management of vascular access device: Secondary | ICD-10-CM

## 2015-04-13 MED ORDER — SODIUM CHLORIDE 0.9 % IJ SOLN
10.0000 mL | INTRAMUSCULAR | Status: DC | PRN
  Administered 2015-04-13: 10 mL
  Filled 2015-04-13: qty 10

## 2015-04-13 MED ORDER — HEPARIN SOD (PORK) LOCK FLUSH 100 UNIT/ML IV SOLN
500.0000 [IU] | Freq: Once | INTRAVENOUS | Status: AC | PRN
  Administered 2015-04-13: 500 [IU]

## 2015-04-13 NOTE — Patient Instructions (Signed)
[facility] at [facility] Discharge Instructions  RECOMMENDATIONS MADE BY THE CONSULTANT AND ANY TEST RESULTS WILL BE SENT TO YOUR REFERRING PHYSICIAN.  Pump removed. Port flushed. Return as scheduled. Please call the clinic if you have any questions or concerns.   Thank you for choosing  [facility] to provide your oncology and hematology care.  To afford each patient quality time with our provider, please arrive at least 15 minutes before your scheduled appointment time.    You need to re-schedule your appointment should you arrive 10 or more minutes late.  We strive to give you quality time with our providers, and arriving late affects you and other patients whose appointments are after yours.  Also, if you no show three or more times for appointments you may be dismissed from the clinic at the providers discretion.     Again, thank you for choosing [facility].  Our hope is that these requests will decrease the amount of time that you wait before being seen by our physicians.       _____________________________________________________________  Should you have questions after your visit to [facility], please contact our office at [phone] between the hours of 8:30 a.m. and 4:30 p.m.  Voicemails left after 4:30 p.m. will not be returned until the following business day.  For prescription refill requests, have your pharmacy contact our office.    

## 2015-04-13 NOTE — Progress Notes (Signed)
Marca Ancona presented for Portacath de-access and flush.  Proper placement of portacath confirmed by CXR.  Portacath located left chest wall accessed with  H 20 needle.  Good blood return present. Portacath flushed with 70ml NS and 500U/6ml Heparin and needle removed intact.  Procedure tolerated well and without incident.  Pump removed

## 2015-04-14 ENCOUNTER — Encounter (HOSPITAL_COMMUNITY): Payer: PRIVATE HEALTH INSURANCE

## 2015-04-16 ENCOUNTER — Encounter (HOSPITAL_BASED_OUTPATIENT_CLINIC_OR_DEPARTMENT_OTHER): Payer: PRIVATE HEALTH INSURANCE

## 2015-04-16 VITALS — BP 132/41 | HR 63 | Temp 97.5°F | Resp 16 | Wt 99.0 lb

## 2015-04-16 DIAGNOSIS — C160 Malignant neoplasm of cardia: Secondary | ICD-10-CM | POA: Diagnosis not present

## 2015-04-16 DIAGNOSIS — Z5111 Encounter for antineoplastic chemotherapy: Secondary | ICD-10-CM | POA: Diagnosis not present

## 2015-04-16 DIAGNOSIS — C169 Malignant neoplasm of stomach, unspecified: Secondary | ICD-10-CM

## 2015-04-16 DIAGNOSIS — R112 Nausea with vomiting, unspecified: Secondary | ICD-10-CM

## 2015-04-16 LAB — CBC WITH DIFFERENTIAL/PLATELET
BASOS ABS: 0 10*3/uL (ref 0.0–0.1)
Basophils Relative: 1 %
Eosinophils Absolute: 0.7 10*3/uL (ref 0.0–0.7)
Eosinophils Relative: 13 %
HEMATOCRIT: 35.1 % — AB (ref 36.0–46.0)
Hemoglobin: 12 g/dL (ref 12.0–15.0)
LYMPHS ABS: 1.1 10*3/uL (ref 0.7–4.0)
LYMPHS PCT: 19 %
MCH: 32.8 pg (ref 26.0–34.0)
MCHC: 34.2 g/dL (ref 30.0–36.0)
MCV: 95.9 fL (ref 78.0–100.0)
MONO ABS: 0.4 10*3/uL (ref 0.1–1.0)
MONOS PCT: 7 %
NEUTROS ABS: 3.5 10*3/uL (ref 1.7–7.7)
Neutrophils Relative %: 60 %
Platelets: 186 10*3/uL (ref 150–400)
RBC: 3.66 MIL/uL — ABNORMAL LOW (ref 3.87–5.11)
RDW: 13.5 % (ref 11.5–15.5)
WBC: 5.8 10*3/uL (ref 4.0–10.5)

## 2015-04-16 LAB — COMPREHENSIVE METABOLIC PANEL
ALT: 16 U/L (ref 14–54)
AST: 21 U/L (ref 15–41)
Albumin: 4.1 g/dL (ref 3.5–5.0)
Alkaline Phosphatase: 45 U/L (ref 38–126)
Anion gap: 3 — ABNORMAL LOW (ref 5–15)
BUN: 13 mg/dL (ref 6–20)
CO2: 26 mmol/L (ref 22–32)
CREATININE: 0.73 mg/dL (ref 0.44–1.00)
Calcium: 9.2 mg/dL (ref 8.9–10.3)
Chloride: 111 mmol/L (ref 101–111)
GFR calc Af Amer: >60 mL/min (ref >60)
Glucose, Bld: 94 mg/dL (ref 65–99)
POTASSIUM: 4 mmol/L (ref 3.5–5.1)
Sodium: 140 mmol/L (ref 135–145)
TOTAL PROTEIN: 6.3 g/dL — AB (ref 6.5–8.1)
Total Bilirubin: 0.7 mg/dL (ref 0.3–1.2)

## 2015-04-16 MED ORDER — PALONOSETRON HCL INJECTION 0.25 MG/5ML
0.2500 mg | Freq: Once | INTRAVENOUS | Status: AC
  Administered 2015-04-16: 0.25 mg via INTRAVENOUS
  Filled 2015-04-16: qty 5

## 2015-04-16 MED ORDER — SODIUM CHLORIDE 0.9 % IV SOLN
250.0000 mg/m2/d | INTRAVENOUS | Status: DC
  Administered 2015-04-16: 2450 mg via INTRAVENOUS
  Filled 2015-04-16: qty 49

## 2015-04-16 MED ORDER — SODIUM CHLORIDE 0.9 % IV SOLN
12.0000 mg | Freq: Once | INTRAVENOUS | Status: AC
  Administered 2015-04-16: 12 mg via INTRAVENOUS
  Filled 2015-04-16: qty 1.2

## 2015-04-16 MED ORDER — SODIUM CHLORIDE 0.9 % IV SOLN
Freq: Once | INTRAVENOUS | Status: AC
  Administered 2015-04-16: 11:00:00 via INTRAVENOUS

## 2015-04-16 MED ORDER — SODIUM CHLORIDE 0.9 % IJ SOLN: 10.0000 mL | INTRAMUSCULAR | Status: DC | PRN

## 2015-04-16 NOTE — Patient Instructions (Signed)
[facility] Discharge Instructions for Patients Receiving Chemotherapy  Today you received the following chemotherapy agent: 50fu.  Please see new schedule for return appointment.   Remember that you are to wear your pump until next Monday, 7 days, instead of just 5 days as before.   Please call us with any questions or uncontrolled symptoms.     If you develop nausea and vomiting, or diarrhea that is not controlled by your medication, call the clinic.  The clinic phone number is [phone]. Office hours are Monday-Friday 8:30am-5:00pm.  BELOW ARE SYMPTOMS THAT SHOULD BE REPORTED IMMEDIATELY:  *FEVER GREATER THAN 101.0 F  *CHILLS WITH OR WITHOUT FEVER  NAUSEA AND VOMITING THAT IS NOT CONTROLLED WITH YOUR NAUSEA MEDICATION  *UNUSUAL SHORTNESS OF BREATH  *UNUSUAL BRUISING OR BLEEDING  TENDERNESS IN MOUTH AND THROAT WITH OR WITHOUT PRESENCE OF ULCERS  *URINARY PROBLEMS  *BOWEL PROBLEMS  UNUSUAL RASH Items with * indicate a potential emergency and should be followed up as soon as possible. If you have an emergency after office hours please contact your primary care physician or go to the nearest emergency department.  Please call the clinic during office hours if you have any questions or concerns.   You may also contact the Patient Navigator at [phone] should you have any questions or need assistance in obtaining follow up care.

## 2015-04-20 ENCOUNTER — Encounter (HOSPITAL_COMMUNITY): Payer: PRIVATE HEALTH INSURANCE

## 2015-04-23 ENCOUNTER — Encounter (HOSPITAL_COMMUNITY): Payer: PRIVATE HEALTH INSURANCE | Attending: Oncology

## 2015-04-23 VITALS — BP 125/43 | HR 60 | Temp 97.7°F | Resp 18 | Wt 99.0 lb

## 2015-04-23 DIAGNOSIS — C163 Malignant neoplasm of pyloric antrum: Secondary | ICD-10-CM | POA: Diagnosis present

## 2015-04-23 DIAGNOSIS — Z452 Encounter for adjustment and management of vascular access device: Secondary | ICD-10-CM

## 2015-04-23 DIAGNOSIS — R112 Nausea with vomiting, unspecified: Secondary | ICD-10-CM | POA: Diagnosis present

## 2015-04-23 DIAGNOSIS — C160 Malignant neoplasm of cardia: Secondary | ICD-10-CM | POA: Diagnosis present

## 2015-04-23 DIAGNOSIS — Z95828 Presence of other vascular implants and grafts: Secondary | ICD-10-CM

## 2015-04-23 DIAGNOSIS — C169 Malignant neoplasm of stomach, unspecified: Secondary | ICD-10-CM | POA: Insufficient documentation

## 2015-04-23 LAB — COMPREHENSIVE METABOLIC PANEL
ALBUMIN: 4 g/dL (ref 3.5–5.0)
ALT: 19 U/L (ref 14–54)
AST: 23 U/L (ref 15–41)
Alkaline Phosphatase: 42 U/L (ref 38–126)
Anion gap: 4 — ABNORMAL LOW (ref 5–15)
BILIRUBIN TOTAL: 0.9 mg/dL (ref 0.3–1.2)
BUN: 17 mg/dL (ref 6–20)
CHLORIDE: 109 mmol/L (ref 101–111)
CO2: 25 mmol/L (ref 22–32)
Calcium: 9.2 mg/dL (ref 8.9–10.3)
Creatinine, Ser: 0.75 mg/dL (ref 0.44–1.00)
GFR calc Af Amer: >60 mL/min (ref >60)
GFR calc non Af Amer: >60 mL/min (ref >60)
GLUCOSE: 107 mg/dL — AB (ref 65–99)
POTASSIUM: 4 mmol/L (ref 3.5–5.1)
Sodium: 138 mmol/L (ref 135–145)
TOTAL PROTEIN: 6.6 g/dL (ref 6.5–8.1)

## 2015-04-23 LAB — CBC WITH DIFFERENTIAL/PLATELET
BASOS ABS: 0 10*3/uL (ref 0.0–0.1)
BASOS PCT: 0 %
Eosinophils Absolute: 0.7 10*3/uL (ref 0.0–0.7)
Eosinophils Relative: 11 %
HEMATOCRIT: 34.8 % — AB (ref 36.0–46.0)
Hemoglobin: 12 g/dL (ref 12.0–15.0)
Lymphocytes Relative: 10 %
Lymphs Abs: 0.6 10*3/uL — ABNORMAL LOW (ref 0.7–4.0)
MCH: 32.9 pg (ref 26.0–34.0)
MCHC: 34.5 g/dL (ref 30.0–36.0)
MCV: 95.3 fL (ref 78.0–100.0)
MONO ABS: 0.5 10*3/uL (ref 0.1–1.0)
Monocytes Relative: 7 %
NEUTROS ABS: 4.9 10*3/uL (ref 1.7–7.7)
NEUTROS PCT: 72 %
Platelets: 146 10*3/uL — ABNORMAL LOW (ref 150–400)
RBC: 3.65 MIL/uL — AB (ref 3.87–5.11)
RDW: 14 % (ref 11.5–15.5)
WBC: 6.7 10*3/uL (ref 4.0–10.5)

## 2015-04-23 MED ORDER — SODIUM CHLORIDE 0.9 % IV SOLN: Freq: Once | INTRAVENOUS | Status: DC

## 2015-04-23 MED ORDER — MAGIC MOUTHWASH: 5.0000 mL | Freq: Four times a day (QID) | ORAL | Status: AC | PRN

## 2015-04-23 MED ORDER — SODIUM CHLORIDE 0.9 % IJ SOLN
10.0000 mL | Freq: Once | INTRAMUSCULAR | Status: AC
  Administered 2015-04-23: 10 mL via INTRAVENOUS

## 2015-04-23 MED ORDER — SODIUM CHLORIDE 0.9 % IV SOLN
12.0000 mg | Freq: Once | INTRAVENOUS | Status: DC
  Filled 2015-04-23: qty 1.2

## 2015-04-23 MED ORDER — HEPARIN SOD (PORK) LOCK FLUSH 100 UNIT/ML IV SOLN
500.0000 [IU] | Freq: Once | INTRAVENOUS | Status: AC
  Administered 2015-04-23: 500 [IU] via INTRAVENOUS

## 2015-04-23 MED ORDER — PALONOSETRON HCL INJECTION 0.25 MG/5ML: 0.2500 mg | Freq: Once | INTRAVENOUS | Status: DC

## 2015-04-23 NOTE — Progress Notes (Signed)
Patient reports ulcers in mouth x 3. Started a few days ago. Thought she just had chapped lips but then an ulcer developed inside lower lip. Area to both upper and lower left cheek as well. Slight decrease in appetite but no other complaints related to chemo. Lab work drawn from port. Flushed with saline and heparin and de-accessed port needle.  Hold chemo today due to mouth ulcers. Instruct patient that MD wants all ulcers in mouth healed before starting chemo pump again. Also informed patient MD will dose reduce chemo before next restart. RX called in and hard copy given to patient for Magic Mouthwash with instructions. Patient has to be at work at [employer] Wednesday and she doesn't think mouth will be healed by then. Patient states she is working all day Thursday and would rather wait and come Friday after radiation to have pump put back on. Scheduled per patient request.

## 2015-04-23 NOTE — Patient Instructions (Signed)
[place] at [facility] Discharge Instructions  RECOMMENDATIONS MADE BY THE CONSULTANT AND ANY TEST RESULTS WILL BE SENT TO YOUR REFERRING PHYSICIAN.  Hold chemo pump today due to mouth ulcers. RX for magic mouthwash called in/hard copy given to patient. All mouth  Ulcers are to be healed before restarting chemo. Chemo will be dose reduced at next pump restart. Return as scheduled. If mouth ulcers have not healed by Friday call Olivia Mackie with info and reschedule appointments.  Thank you for choosing [place] at [facility] to provide your oncology and hematology care.  To afford each patient quality time with our provider, please arrive at least 15 minutes before your scheduled appointment time.    You need to re-schedule your appointment should you arrive 10 or more minutes late.  We strive to give you quality time with our providers, and arriving late affects you and other patients whose appointments are after yours.  Also, if you no show three or more times for appointments you may be dismissed from the clinic at the providers discretion.     Again, thank you for choosing [facility].  Our hope is that these requests will decrease the amount of time that you wait before being seen by our physicians.       _____________________________________________________________  Should you have questions after your visit to [facility], please contact our office at [phone] between the hours of 8:30 a.m. and 4:30 p.m.  Voicemails left after 4:30 p.m. will not be returned until the following business day.  For prescription refill requests, have your pharmacy contact our office.

## 2015-04-27 ENCOUNTER — Encounter (HOSPITAL_COMMUNITY): Payer: PRIVATE HEALTH INSURANCE

## 2015-04-27 ENCOUNTER — Inpatient Hospital Stay (HOSPITAL_COMMUNITY): Payer: PRIVATE HEALTH INSURANCE

## 2015-04-29 NOTE — Progress Notes (Signed)
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  INTERVAL HISTORY: Heidi Cooper 59 y.o. 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 Negative margins at time of surgery, and 2/14 positive lymph nodes for metastatic disease.    Gastric cancer ([place])   01/21/2015 Imaging CT abd/pelvis- Gastroptosis with massive enlargement of the stomach, but no acute bowel obstruction. Query severe gastroparesis. See coronal image 33. No other acute or inflammatory process identified in the abdomen or pelvis. Diverticulosis   02/06/2015 - 02/19/2015 Hospital Admission Nausea with vomiting, Gastric outlet obstruction, Malnutrition of moderate degree ([place])   02/06/2015 Procedure EGD by Dr. Oneida Alar   02/06/2015 Pathology Results Stomach, biopsy, pre-pylori - ULCERATED, POORLY DIFFERENTIATED ADENOCARCINOMA   02/09/2015 Definitive Surgery Dr. Arnoldo Morale- partial gastrectomy (distal antrectomy) with Roux-en-Y gastrojejunostomy, right hemicolectomy.   02/09/2015 Pathology Results Stomach, resection for tumor, distal stomach SIGNET RING CELL CARCINOMA (4.5 CM, 1 CM IN DEPTH) TUMOR INVADES VISCERAL PERITONEUM LYMPHOVASCULAR INVASION IDENTIFIED,MARGINS OF RESECTION ARE NEGATIVE FOR TUMOR METASTATIC CARCINOMA IN 2/14   02/09/2015 Pathology Results HER NEGATIVE   02/12/2015 Imaging DG UGI- Patent gastrojejunostomy anastomosis post distal gastrectomy. No evidence of contrast extravasation to suggest anastomotic leak.   03/20/2015 - 04/08/2015 Chemotherapy 5FU/Leucovorin days 1, and 15 x 1 cycle.   04/09/2015 -  Radiation Therapy Dr. Isidore Moos   04/09/2015 -  Chemotherapy 5 FU CI   04/23/2015 Adverse Reaction  Intra-oral ulcers.  Chemotherapy held and dose-reduced.   04/23/2015 Treatment Plan Change Treatment held and dose reduced.    SHE WAS A NO-SHOW for her 11/21 follow-up appointment.  On 12/5, the patient reported for her next chemotherapy but reported to assessing nurse that she has developed intra-oral ulcers.  As a result, she was prescribed Magic Mouthwash and chemotherapy was held and dose-reduced.  Oncology history is updated accordingly.  I personally reviewed and went over laboratory results with the patient.  The results are noted within this dictation.  Labs meet treatment parameters today.  Platelet count of 110,000 is noted.  She reports that her intra-oral lesions are completed resolved.  She looks good and feels good.    She notes some intermittent epigastric discomfort.  She relates this to taking medication on an empty stomach.  Intermittently, she take Hydrocodone 5/325 and notes that she takes 2 at a time.  She requests a refill on this pain medication.  She reports that it is effective for her.    She notes chronic constipation which she manages with medications.  She notes some SOB on exertion.  She denies any chest pain or sudden onset of SOB.  She notes it only after significant exertion, ie walking up a few flights of stairs.  Past Medical History  Diagnosis Date  . Migraine   . Gastric cancer ([name]) 02/27/2015    has Abnormal weight loss; Abdominal pain, epigastric; Nausea with vomiting; RLQ abdominal mass; Gastric outlet obstruction; Malnutrition of moderate degree ([place]); GERD (gastroesophageal reflux disease); Hypokalemia; Chronic migraine; and Gastric cancer ([name]) on her problem list.     is allergic to other; sulfa antibiotics; and penicillins.  Current Outpatient Prescriptions on File Prior to Visit  Medication Sig Dispense Refill  . calcium carbonate (  TUMS - DOSED IN MG ELEMENTAL CALCIUM) 500 MG chewable tablet Chew 1 tablet by mouth as needed for indigestion  or heartburn.    . calcium citrate-vitamin D (CITRACAL+D) 315-200 MG-UNIT tablet Take 1 tablet by mouth daily.    . Cholecalciferol (VITAMIN D3) 2000 UNITS TABS Take 1 tablet by mouth daily.    . cyanocobalamin 500 MCG tablet Take 500 mcg by mouth daily.    Marland Kitchen dextrose 5 % SOLN 1,000 mL with fluorouracil 5 GM/100ML SOLN Inject into the vein. To start around first of November tentatively    . escitalopram (LEXAPRO) 20 MG tablet Take 1/2 tab for 5 days then increase to 1 tab daily. (Patient taking differently: Take 20 mg by mouth daily. ) 30 tablet 3  . fluticasone (FLONASE) 50 MCG/ACT nasal spray Place 2 sprays into the nose daily as needed for allergies.     Marland Kitchen lidocaine-prilocaine (EMLA) cream Apply a quarter size amount to port site 1 hour prior to chemo. Do not rub in. Cover with plastic wrap. 30 g 2  . loperamide (IMODIUM) 2 MG capsule Take 2 mg by mouth as needed for diarrhea or loose stools.    . magic mouthwash SOLN Take 5 mLs by mouth 4 (four) times daily as needed for mouth pain. 240 mL 1  . ondansetron (ZOFRAN-ODT) 8 MG disintegrating tablet Take 1 tablet (8 mg total) by mouth every 8 (eight) hours as needed for nausea. 30 tablet 2  . polyethylene glycol (MIRALAX / GLYCOLAX) packet Take 17 g by mouth as needed.    . potassium chloride (K-DUR) 10 MEQ tablet Take 1 tablet (10 mEq total) by mouth 2 (two) times daily. (Patient not taking: Reported on 03/20/2015) 28 tablet 0  . promethazine (PHENERGAN) 12.5 MG tablet Take 1 tablet (12.5 mg total) by mouth every 6 (six) hours as needed for nausea or vomiting. (Patient not taking: Reported on 03/20/2015) 30 tablet 1  . Sennosides (SENOKOT PO) Take 1 tablet by mouth as needed.    . topiramate (TOPAMAX) 25 MG tablet Take 25 mg by mouth at bedtime.      No current facility-administered medications on file prior to visit.    Past Surgical History  Procedure Laterality Date  . Tonsillectomy    . Esophagogastroduodenoscopy N/A 02/06/2015     Procedure: ESOPHAGOGASTRODUODENOSCOPY (EGD);  Surgeon: Danie Binder, MD;  Location: [facility] END[facility]ITE;  Service: Endoscopy;  Laterality: N/A;  . Gastrectomy N/A 02/09/2015    Procedure: DISTAL GASTRECTOMY WITH GASTROJEJUNOSTOMY; RIGHT HEMICOLECTOMY;  Surgeon: Aviva Signs, MD;  Location: [facility] ORS;  Service: General;  Laterality: N/A;  . Portacath placement N/A 03/09/2015    Procedure: INSERTION PORT-A-CATH-left subclavian;  Surgeon: Aviva Signs, MD;  Location: [facility] ORS;  Service: General;  Laterality: N/A;    Denies any headaches, dizziness, double vision, fevers, chills, night sweats, nausea, vomiting, diarrhea, constipation, chest pain, heart palpitations, shortness of breath, blood in stool, black tarry stool, urinary pain, urinary burning, urinary frequency, hematuria.   PHYSICAL EXAMINATION  ECOG PERFORMANCE STATUS: 1 - Symptomatic but completely ambulatory  There were no vitals filed for this visit.  GENERAL:alert, no distress, cachectic, comfortable, cooperative, smiling and in a chemo-recliner, unaccompanied with make-up and looking very well. SKIN: skin color, texture, turgor are normal, no rashes or significant lesions HEAD: Normocephalic, No masses, lesions, tenderness or abnormalities EYES: normal, PERRLA, EOMI, Conjunctiva are pink and non-injected EARS: External ears normal OROPHARYNX:lips, buccal mucosa, and tongue normal and mucous membranes are moist  NECK:  supple, trachea midline LYMPH:  not examined BREAST:not examined LUNGS: clear to auscultation  HEART: regular rate & rhythm ABDOMEN:abdomen soft and non-tender BACK: No CVA tenderness EXTREMITIES:less then 2 second capillary refill, no joint deformities, effusion, or inflammation, no skin discoloration, no cyanosis  NEURO: alert & oriented x 3 with fluent speech, no focal motor/sensory deficits   LABORATORY DATA: CBC    Component Value Date/Time   WBC 5.7 04/30/2015 0930   RBC 3.52* 04/30/2015 0930   HGB 11.6*  04/30/2015 0930   HCT 34.0* 04/30/2015 0930   PLT 110* 04/30/2015 0930   MCV 96.6 04/30/2015 0930   MCH 33.0 04/30/2015 0930   MCHC 34.1 04/30/2015 0930   RDW 15.2 04/30/2015 0930   LYMPHSABS 0.3* 04/30/2015 0930   MONOABS 0.6 04/30/2015 0930   EOSABS 0.5 04/30/2015 0930   BASOSABS 0.0 04/30/2015 0930      Chemistry      Component Value Date/Time   NA 137 04/30/2015 0930   K 3.7 04/30/2015 0930   CL 105 04/30/2015 0930   CO2 25 04/30/2015 0930   BUN 15 04/30/2015 0930   CREATININE 0.80 04/30/2015 0930      Component Value Date/Time   CALCIUM 9.2 04/30/2015 0930   ALKPHOS 53 04/30/2015 0930   AST 32 04/30/2015 0930   ALT 23 04/30/2015 0930   BILITOT 0.8 04/30/2015 0930        PENDING LABS:   RADIOGRAPHIC STUDIES:  No results found.   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 Negative margins at time of surgery, and 2/14 positive lymph nodes for metastatic disease.  Oncology history is updated.  Pre-chemo labs today: CBC diff, CMET.  She meets treatment parameters.  Will refer the patient to Medical Oncology in [place], [place] ([facility]) due to insurance requirements.  She will complete Radiation prior to the New Year, she reports, if all goes according to plan.  I will request that she get seen prior to the New Year to establish her medical oncology care.  We have been following treatment according to the NCCN guidelines.  After completing concomitant chemoradiation, she will need 2 cycles of: Leucovorin 400 mg/m2 IB days 1 and 15 Fluorouracil 400 mg/m2 IV push on days 1 and 15 Fluorouracil 600 mg/m2 IV continuous infusion over 22 hours daily on days 1, 2, 15, and 16 every 28 days  I will try to contact someone at the [facility] in the interim.  I have refilled her pain  medication, but increased the Hydrocodone dose to 10/325.  She can take 1 tablet every 4-6 hours for pain.  Return in 1 week for follow-up.      THERAPY PLAN:  Continue treatment as planned.  All questions were answered. The patient knows to call the clinic with any problems, questions or concerns. We can certainly see the patient much sooner if necessary.  Patient and plan discussed with Dr. Ancil Linsey and she is in agreement with the aforementioned.   This note is electronically signed by: Doy Mince 04/30/2015 5:26 PM

## 2015-04-29 NOTE — Assessment & Plan Note (Addendum)
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 Negative margins at time of surgery, and 2/14 positive lymph nodes for metastatic disease.  Oncology history is updated.  Pre-chemo labs today: CBC diff, CMET.  She meets treatment parameters.  Will refer the patient to Medical Oncology in [place], [place] ([facility]) due to insurance requirements.  She will complete Radiation prior to the New Year, she reports, if all goes according to plan.  I will request that she get seen prior to the New Year to establish her medical oncology care.  We have been following treatment according to the NCCN guidelines.  After completing concomitant chemoradiation, she will need 2 cycles of: Leucovorin 400 mg/m2 IB days 1 and 15 Fluorouracil 400 mg/m2 IV push on days 1 and 15 Fluorouracil 600 mg/m2 IV continuous infusion over 22 hours daily on days 1, 2, 15, and 16 every 28 days  I will try to contact someone at the [facility] in the interim.  I have refilled her pain medication, but increased the Hydrocodone dose to 10/325.  She can take 1 tablet every 4-6 hours for pain.  Return in 1 week for follow-up.

## 2015-04-30 ENCOUNTER — Encounter (HOSPITAL_BASED_OUTPATIENT_CLINIC_OR_DEPARTMENT_OTHER): Payer: PRIVATE HEALTH INSURANCE | Admitting: Oncology

## 2015-04-30 ENCOUNTER — Encounter (HOSPITAL_BASED_OUTPATIENT_CLINIC_OR_DEPARTMENT_OTHER): Payer: PRIVATE HEALTH INSURANCE

## 2015-04-30 ENCOUNTER — Encounter (HOSPITAL_COMMUNITY): Payer: Self-pay | Admitting: Lab

## 2015-04-30 VITALS — BP 123/62 | HR 65 | Temp 97.7°F | Resp 18 | Wt 99.0 lb

## 2015-04-30 DIAGNOSIS — Z5111 Encounter for antineoplastic chemotherapy: Secondary | ICD-10-CM

## 2015-04-30 DIAGNOSIS — C160 Malignant neoplasm of cardia: Secondary | ICD-10-CM | POA: Diagnosis not present

## 2015-04-30 DIAGNOSIS — C163 Malignant neoplasm of pyloric antrum: Secondary | ICD-10-CM

## 2015-04-30 DIAGNOSIS — K5909 Other constipation: Secondary | ICD-10-CM | POA: Diagnosis not present

## 2015-04-30 DIAGNOSIS — R1013 Epigastric pain: Secondary | ICD-10-CM | POA: Diagnosis not present

## 2015-04-30 DIAGNOSIS — C779 Secondary and unspecified malignant neoplasm of lymph node, unspecified: Secondary | ICD-10-CM | POA: Diagnosis not present

## 2015-04-30 DIAGNOSIS — C169 Malignant neoplasm of stomach, unspecified: Secondary | ICD-10-CM

## 2015-04-30 DIAGNOSIS — R112 Nausea with vomiting, unspecified: Secondary | ICD-10-CM

## 2015-04-30 LAB — CBC WITH DIFFERENTIAL/PLATELET
Basophils Absolute: 0 10*3/uL (ref 0.0–0.1)
Basophils Relative: 1 %
EOS ABS: 0.5 10*3/uL (ref 0.0–0.7)
EOS PCT: 8 %
HCT: 34 % — ABNORMAL LOW (ref 36.0–46.0)
Hemoglobin: 11.6 g/dL — ABNORMAL LOW (ref 12.0–15.0)
LYMPHS ABS: 0.3 10*3/uL — AB (ref 0.7–4.0)
Lymphocytes Relative: 4 %
MCH: 33 pg (ref 26.0–34.0)
MCHC: 34.1 g/dL (ref 30.0–36.0)
MCV: 96.6 fL (ref 78.0–100.0)
MONOS PCT: 11 %
Monocytes Absolute: 0.6 10*3/uL (ref 0.1–1.0)
Neutro Abs: 4.4 10*3/uL (ref 1.7–7.7)
Neutrophils Relative %: 76 %
PLATELETS: 110 10*3/uL — AB (ref 150–400)
RBC: 3.52 MIL/uL — ABNORMAL LOW (ref 3.87–5.11)
RDW: 15.2 % (ref 11.5–15.5)
WBC: 5.7 10*3/uL (ref 4.0–10.5)

## 2015-04-30 LAB — COMPREHENSIVE METABOLIC PANEL
ALT: 23 U/L (ref 14–54)
ANION GAP: 7 (ref 5–15)
AST: 32 U/L (ref 15–41)
Albumin: 4 g/dL (ref 3.5–5.0)
Alkaline Phosphatase: 53 U/L (ref 38–126)
BUN: 15 mg/dL (ref 6–20)
CHLORIDE: 105 mmol/L (ref 101–111)
CO2: 25 mmol/L (ref 22–32)
Calcium: 9.2 mg/dL (ref 8.9–10.3)
Creatinine, Ser: 0.8 mg/dL (ref 0.44–1.00)
GFR calc non Af Amer: >60 mL/min (ref >60)
Glucose, Bld: 99 mg/dL (ref 65–99)
Potassium: 3.7 mmol/L (ref 3.5–5.1)
SODIUM: 137 mmol/L (ref 135–145)
Total Bilirubin: 0.8 mg/dL (ref 0.3–1.2)
Total Protein: 6.5 g/dL (ref 6.5–8.1)

## 2015-04-30 MED ORDER — SODIUM CHLORIDE 0.9 % IV SOLN
12.0000 mg | Freq: Once | INTRAVENOUS | Status: AC
  Administered 2015-04-30: 12 mg via INTRAVENOUS
  Filled 2015-04-30: qty 1.2

## 2015-04-30 MED ORDER — SODIUM CHLORIDE 0.9 % IV SOLN
200.0000 mg/m2/d | INTRAVENOUS | Status: DC
  Administered 2015-04-30: 1950 mg via INTRAVENOUS
  Filled 2015-04-30: qty 39

## 2015-04-30 MED ORDER — HYDROCODONE-ACETAMINOPHEN 10-325 MG PO TABS: 1.0000 | ORAL_TABLET | Freq: Four times a day (QID) | ORAL | Status: AC | PRN

## 2015-04-30 MED ORDER — SODIUM CHLORIDE 0.9 % IJ SOLN: 10.0000 mL | INTRAMUSCULAR | Status: DC | PRN

## 2015-04-30 MED ORDER — PALONOSETRON HCL INJECTION 0.25 MG/5ML
0.2500 mg | Freq: Once | INTRAVENOUS | Status: AC
  Administered 2015-04-30: 0.25 mg via INTRAVENOUS

## 2015-04-30 MED ORDER — SODIUM CHLORIDE 0.9 % IV SOLN
INTRAVENOUS | Status: DC
  Administered 2015-04-30: 11:00:00 via INTRAVENOUS

## 2015-04-30 NOTE — Patient Instructions (Addendum)
[place] at [facility] Discharge Instructions  RECOMMENDATIONS MADE BY THE CONSULTANT AND ANY TEST RESULTS WILL BE SENT TO YOUR REFERRING PHYSICIAN.  Exam and discussion by Robynn Pane, PA-C Will treat today Report fevers, uncontrolled nausea, vomiting, diarrhea or other concerns. Will make referral to Dr. Caffie Pinto in [place], [place] for appointment to continue Oncology Care.  Follow-up: Return as scheduled 05/07/15  Thank you for choosing [place] at [facility] to provide your oncology and hematology care.  To afford each patient quality time with our provider, please arrive at least 15 minutes before your scheduled appointment time.    You need to re-schedule your appointment should you arrive 10 or more minutes late.  We strive to give you quality time with our providers, and arriving late affects you and other patients whose appointments are after yours.  Also, if you no show three or more times for appointments you may be dismissed from the clinic at the providers discretion.     Again, thank you for choosing [facility].  Our hope is that these requests will decrease the amount of time that you wait before being seen by our physicians.       _____________________________________________________________  Should you have questions after your visit to [facility], please contact our office at [phone] between the hours of 8:30 a.m. and 4:30 p.m.  Voicemails left after 4:30 p.m. will not be returned until the following business day.  For prescription refill requests, have your pharmacy contact our office.

## 2015-04-30 NOTE — Progress Notes (Signed)
Patient tolerated infusion well.  Home infusion pump connected without difficulty.

## 2015-04-30 NOTE — Progress Notes (Signed)
Referral sent to Dr Caffie Pinto ( [place]) .  Records faxed on 12/12.

## 2015-04-30 NOTE — Patient Instructions (Signed)
[facility] Discharge Instructions for Patients Receiving Chemotherapy  Today you received the following chemotherapy agent: 5FU home infusion pump.     If you develop nausea and vomiting, or diarrhea that is not controlled by your medication, call the clinic.  The clinic phone number is [phone]. Office hours are Monday-Friday 8:30am-5:00pm.  BELOW ARE SYMPTOMS THAT SHOULD BE REPORTED IMMEDIATELY:  *FEVER GREATER THAN 101.0 F  *CHILLS WITH OR WITHOUT FEVER  NAUSEA AND VOMITING THAT IS NOT CONTROLLED WITH YOUR NAUSEA MEDICATION  *UNUSUAL SHORTNESS OF BREATH  *UNUSUAL BRUISING OR BLEEDING  TENDERNESS IN MOUTH AND THROAT WITH OR WITHOUT PRESENCE OF ULCERS  *URINARY PROBLEMS  *BOWEL PROBLEMS  UNUSUAL RASH Items with * indicate a potential emergency and should be followed up as soon as possible. If you have an emergency after office hours please contact your primary care physician or go to the nearest emergency department.  Please call the clinic during office hours if you have any questions or concerns.   You may also contact the Patient Navigator at [phone] should you have any questions or need assistance in obtaining follow up care.

## 2015-05-04 ENCOUNTER — Encounter (HOSPITAL_COMMUNITY): Payer: PRIVATE HEALTH INSURANCE

## 2015-05-04 NOTE — Progress Notes (Signed)
This encounter was created in error - please disregard.

## 2015-05-06 NOTE — Assessment & Plan Note (Addendum)
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 Negative margins at time of surgery, and 2/14 positive lymph nodes for metastatic disease.  Oncology history is up-to-date.  Pre-chemo labs today: CBC diff, CMET.  She meets treatment parameters.  Her chemotherapy treatment plan is adjusted accordingly.  I will add an additional day of treatment for this week to get her through the Christmas Holiday.  She will return on 12/27 for pump D/C and restart of treatment accordingly.  XRT is to continue through 05/18/2015.  I have discussed this with [place] (Rad Agricultural engineer) who confirmed finish date.  Will refer the patient to Medical Oncology in [place], [place] ([facility]) due to insurance requirements.  She will complete Radiation prior to the New Year, she reports, if all goes according to plan.  I will request that she get seen prior to the New Year to establish her medical oncology care.  We have been following treatment according to the NCCN guidelines.  After completing concomitant chemoradiation, she will need 2 cycles of: Leucovorin 400 mg/m2 IB days 1 and 15 Fluorouracil 400 mg/m2 IV push on days 1 and 15 Fluorouracil 600 mg/m2 IV continuous infusion over 22 hours daily on days 1, 2, 15, and 16 every 28 days  She did NOT have a D2 dissection at time of surgery.  She has an appointment in [place] on 05/22/2015 for establishment of oncology care.  Return in 1 week for follow-up.

## 2015-05-06 NOTE — Progress Notes (Signed)
Heidi Manila, MD [address] [place] [id number]  Malignant neoplasm of cardia of stomach [facility])  CURRENT THERAPY: 5FU/Leucovorin weekly days 1-7.  INTERVAL HISTORY: Heidi Cooper 58 y.o. 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 Negative margins at time of surgery, and 2/14 positive lymph nodes for metastatic disease.    Gastric cancer ([place])   01/21/2015 Imaging CT abd/pelvis- Gastroptosis with massive enlargement of the stomach, but no acute bowel obstruction. Query severe gastroparesis. See coronal image 33. No other acute or inflammatory process identified in the abdomen or pelvis. Diverticulosis   02/06/2015 - 02/19/2015 Hospital Admission Nausea with vomiting, Gastric outlet obstruction, Malnutrition of moderate degree ([place])   02/06/2015 Procedure EGD by Dr. Oneida Alar   02/06/2015 Pathology Results Stomach, biopsy, pre-pylori - ULCERATED, POORLY DIFFERENTIATED ADENOCARCINOMA   02/09/2015 Definitive Surgery Dr. Arnoldo Morale- partial gastrectomy (distal antrectomy) with Roux-en-Y gastrojejunostomy, right hemicolectomy.   02/09/2015 Pathology Results Stomach, resection for tumor, distal stomach SIGNET RING CELL CARCINOMA (4.5 CM, 1 CM IN DEPTH) TUMOR INVADES VISCERAL PERITONEUM LYMPHOVASCULAR INVASION IDENTIFIED,MARGINS OF RESECTION ARE NEGATIVE FOR TUMOR METASTATIC CARCINOMA IN 2/14   02/09/2015 Pathology Results HER NEGATIVE   02/12/2015 Imaging DG UGI- Patent gastrojejunostomy anastomosis post distal gastrectomy. No evidence of contrast extravasation to suggest anastomotic leak.   03/20/2015 - 04/08/2015 Chemotherapy 5FU/Leucovorin days 1, and 15 x 1 cycle.   04/09/2015 -  Radiation Therapy Dr. Isidore Moos   04/09/2015 -  Chemotherapy 5 FU CI   04/23/2015 Adverse Reaction Intra-oral ulcers.  Chemotherapy held and dose-reduced.   04/23/2015 Treatment Plan Change  Treatment held and dose reduced.    On 12/5, the patient reported for her next chemotherapy but reported to assessing nurse that she has developed intra-oral ulcers.  As a result, she was prescribed Magic Mouthwash and chemotherapy was held and dose-reduced.  Oncology history is updated accordingly.  I personally reviewed and went over laboratory results with the patient.  The results are noted within this dictation.  Labs meet treatment parameters today.   She denies any stomatitis.  She continues to tolerate treatment without any issues.  She denies any diarrhea/constipation.  Past Medical History  Diagnosis Date  . Migraine   . Gastric cancer ([place]) 02/27/2015    has Abnormal weight loss; Abdominal pain, epigastric; Nausea with vomiting; RLQ abdominal mass; Gastric outlet obstruction; Malnutrition of moderate degree ([place]); GERD (gastroesophageal reflux disease); Hypokalemia; Chronic migraine; and Gastric cancer ([place]) on her problem list.     is allergic to other; sulfa antibiotics; and penicillins.  Current Outpatient Prescriptions on File Prior to Visit  Medication Sig Dispense Refill  . calcium carbonate (TUMS - DOSED IN MG ELEMENTAL CALCIUM) 500 MG chewable tablet Chew 1 tablet by mouth as needed for indigestion or heartburn.    . calcium citrate-vitamin D (CITRACAL+D) 315-200 MG-UNIT tablet Take 1 tablet by mouth daily.    . Cholecalciferol (VITAMIN D3) 2000 UNITS TABS Take 1 tablet by mouth daily.    . cyanocobalamin 500 MCG tablet Take 500 mcg by mouth daily.    Marland Kitchen dextrose 5 % SOLN 1,000 mL with fluorouracil 5 GM/100ML SOLN Inject into the vein. To start around first of November tentatively    . escitalopram (LEXAPRO) 20 MG tablet Take 1/2 tab for 5 days then increase to 1 tab daily. (Patient taking differently: Take 20 mg by mouth  daily. ) 30 tablet 3  . fluticasone (FLONASE) 50 MCG/ACT nasal spray Place 2 sprays into the nose daily as needed for allergies.     Marland Kitchen  HYDROcodone-acetaminophen (NORCO) 10-325 MG tablet Take 1 tablet by mouth every 6 (six) hours as needed. 60 tablet 0  . lidocaine-prilocaine (EMLA) cream Apply a quarter size amount to port site 1 hour prior to chemo. Do not rub in. Cover with plastic wrap. 30 g 2  . ondansetron (ZOFRAN-ODT) 8 MG disintegrating tablet Take 1 tablet (8 mg total) by mouth every 8 (eight) hours as needed for nausea. 30 tablet 2  . polyethylene glycol (MIRALAX / GLYCOLAX) packet Take 17 g by mouth as needed.    . Sennosides (SENOKOT PO) Take 1 tablet by mouth as needed.    . topiramate (TOPAMAX) 25 MG tablet Take 25 mg by mouth at bedtime.     Marland Kitchen loperamide (IMODIUM) 2 MG capsule Take 2 mg by mouth as needed for diarrhea or loose stools. Reported on 05/07/2015    . magic mouthwash SOLN Take 5 mLs by mouth 4 (four) times daily as needed for mouth pain. (Patient not taking: Reported on 05/07/2015) 240 mL 1  . potassium chloride (K-DUR) 10 MEQ tablet Take 1 tablet (10 mEq total) by mouth 2 (two) times daily. (Patient not taking: Reported on 03/20/2015) 28 tablet 0  . promethazine (PHENERGAN) 12.5 MG tablet Take 1 tablet (12.5 mg total) by mouth every 6 (six) hours as needed for nausea or vomiting. (Patient not taking: Reported on 03/20/2015) 30 tablet 1   Current Facility-Administered Medications on File Prior to Visit  Medication Dose Route Frequency Provider Last Rate Last Dose  . 0.9 %  sodium chloride infusion   Intravenous Once Patrici Ranks, MD      . dexamethasone (DECADRON) 12 mg in sodium chloride 0.9 % 50 mL IVPB  12 mg Intravenous Once Patrici Ranks, MD      . heparin lock flush 100 unit/mL  500 Units Intracatheter Once PRN Patrici Ranks, MD      . palonosetron (ALOXI) injection 0.25 mg  0.25 mg Intravenous Once Patrici Ranks, MD      . sodium chloride 0.9 % injection 10 mL  10 mL Intracatheter PRN Patrici Ranks, MD        Past Surgical History  Procedure Laterality Date  . Tonsillectomy      . Esophagogastroduodenoscopy N/A 02/06/2015    Procedure: ESOPHAGOGASTRODUODENOSCOPY (EGD);  Surgeon: Danie Binder, MD;  Location: [facility] SUITE;  Service: Endoscopy;  Laterality: N/A;  . Gastrectomy N/A 02/09/2015    Procedure: DISTAL GASTRECTOMY WITH GASTROJEJUNOSTOMY; RIGHT HEMICOLECTOMY;  Surgeon: Aviva Signs, MD;  Location: [facility] ORS;  Service: General;  Laterality: N/A;  . Portacath placement N/A 03/09/2015    Procedure: INSERTION PORT-A-CATH-left subclavian;  Surgeon: Aviva Signs, MD;  Location: [facility] ORS;  Service: General;  Laterality: N/A;    Denies any headaches, dizziness, double vision, fevers, chills, night sweats, nausea, vomiting, diarrhea, constipation, chest pain, heart palpitations, shortness of breath, blood in stool, black tarry stool, urinary pain, urinary burning, urinary frequency, hematuria.   PHYSICAL EXAMINATION  ECOG PERFORMANCE STATUS: 1 - Symptomatic but completely ambulatory  Filed Vitals:   05/07/15 1101  BP: 105/63  Pulse: 65  Temp: 97.8 F (36.6 C)  Resp: 16    GENERAL:alert, no distress, cachectic, comfortable, cooperative, smiling and in a chemo-recliner, unaccompanied with make-up and looking very well. SKIN: skin color, texture, turgor  are normal, no rashes or significant lesions HEAD: Normocephalic, No masses, lesions, tenderness or abnormalities EYES: normal, PERRLA, EOMI, Conjunctiva are pink and non-injected EARS: External ears normal OROPHARYNX:lips, buccal mucosa, and tongue normal and mucous membranes are moist  NECK: supple, trachea midline LYMPH:  not examined BREAST:not examined LUNGS: clear to auscultation without wheezes, rales, or rhonchi. HEART: regular rate & rhythm without murmur, rub, or gallop. ABDOMEN:abdomen soft and non-tender BACK: No CVA tenderness EXTREMITIES:less then 2 second capillary refill, no joint deformities, effusion, or inflammation, no skin discoloration, no cyanosis  NEURO: alert & oriented x 3 with fluent  speech, no focal motor/sensory deficits   LABORATORY DATA: CBC    Component Value Date/Time   WBC 5.9 05/07/2015 1125   RBC 3.52* 05/07/2015 1125   HGB 11.7* 05/07/2015 1125   HCT 33.9* 05/07/2015 1125   PLT 137* 05/07/2015 1125   MCV 96.3 05/07/2015 1125   MCH 33.2 05/07/2015 1125   MCHC 34.5 05/07/2015 1125   RDW 15.5 05/07/2015 1125   LYMPHSABS 0.2* 05/07/2015 1125   MONOABS 0.7 05/07/2015 1125   EOSABS 0.6 05/07/2015 1125   BASOSABS 0.0 05/07/2015 1125      Chemistry      Component Value Date/Time   NA 139 05/07/2015 1125   K 3.7 05/07/2015 1125   CL 109 05/07/2015 1125   CO2 25 05/07/2015 1125   BUN 15 05/07/2015 1125   CREATININE 0.65 05/07/2015 1125      Component Value Date/Time   CALCIUM 9.4 05/07/2015 1125   ALKPHOS 61 05/07/2015 1125   AST 29 05/07/2015 1125   ALT 23 05/07/2015 1125   BILITOT 0.9 05/07/2015 1125        PENDING LABS:   RADIOGRAPHIC STUDIES:  No results found.   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 Negative margins at time of surgery, and 2/14 positive lymph nodes for metastatic disease.  Oncology history is up-to-date.  Pre-chemo labs today: CBC diff, CMET.  She meets treatment parameters.  Her chemotherapy treatment plan is adjusted accordingly.  I will add an additional day of treatment for this week to get her through the Christmas Holiday.  She will return on 12/27 for pump D/C and restart of treatment accordingly.  XRT is to continue through 05/18/2015.  I have discussed this with [place] (Rad Agricultural engineer) who confirmed finish date.  Will refer the patient to Medical Oncology in [employer], [place] ([facility]) due to insurance requirements.  She will complete Radiation prior to the New Year, she reports, if all goes according to plan.  I will request  that she get seen prior to the New Year to establish her medical oncology care.  We have been following treatment according to the NCCN guidelines.  After completing concomitant chemoradiation, she will need 2 cycles of: Leucovorin 400 mg/m2 IB days 1 and 15 Fluorouracil 400 mg/m2 IV push on days 1 and 15 Fluorouracil 600 mg/m2 IV continuous infusion over 22 hours daily on days 1, 2, 15, and 16 every 28 days  She did NOT have a D2 dissection at time of surgery.  She has an appointment in [place] on 05/22/2015 for establishment of oncology care.  Return in 1 week for follow-up.    THERAPY PLAN:  Continue treatment as planned.  All questions were answered. The patient knows to  call the clinic with any problems, questions or concerns. We can certainly see the patient much sooner if necessary.  Patient and plan discussed with Dr. Ancil Linsey and she is in agreement with the aforementioned.   This note is electronically signed by: Doy Mince 05/07/2015 12:49 PM

## 2015-05-07 ENCOUNTER — Encounter (HOSPITAL_BASED_OUTPATIENT_CLINIC_OR_DEPARTMENT_OTHER): Payer: PRIVATE HEALTH INSURANCE

## 2015-05-07 ENCOUNTER — Encounter (HOSPITAL_BASED_OUTPATIENT_CLINIC_OR_DEPARTMENT_OTHER): Payer: PRIVATE HEALTH INSURANCE | Admitting: Oncology

## 2015-05-07 VITALS — BP 105/63 | HR 65 | Temp 97.8°F | Resp 16 | Wt 98.0 lb

## 2015-05-07 DIAGNOSIS — Z5111 Encounter for antineoplastic chemotherapy: Secondary | ICD-10-CM

## 2015-05-07 DIAGNOSIS — C779 Secondary and unspecified malignant neoplasm of lymph node, unspecified: Secondary | ICD-10-CM | POA: Diagnosis not present

## 2015-05-07 DIAGNOSIS — C169 Malignant neoplasm of stomach, unspecified: Secondary | ICD-10-CM

## 2015-05-07 DIAGNOSIS — C160 Malignant neoplasm of cardia: Secondary | ICD-10-CM

## 2015-05-07 DIAGNOSIS — C163 Malignant neoplasm of pyloric antrum: Secondary | ICD-10-CM

## 2015-05-07 DIAGNOSIS — R112 Nausea with vomiting, unspecified: Secondary | ICD-10-CM

## 2015-05-07 LAB — CBC WITH DIFFERENTIAL/PLATELET
BASOS PCT: 0 %
Basophils Absolute: 0 10*3/uL (ref 0.0–0.1)
EOS ABS: 0.6 10*3/uL (ref 0.0–0.7)
Eosinophils Relative: 10 %
HCT: 33.9 % — ABNORMAL LOW (ref 36.0–46.0)
HEMOGLOBIN: 11.7 g/dL — AB (ref 12.0–15.0)
Lymphocytes Relative: 4 %
Lymphs Abs: 0.2 10*3/uL — ABNORMAL LOW (ref 0.7–4.0)
MCH: 33.2 pg (ref 26.0–34.0)
MCHC: 34.5 g/dL (ref 30.0–36.0)
MCV: 96.3 fL (ref 78.0–100.0)
MONOS PCT: 11 %
Monocytes Absolute: 0.7 10*3/uL (ref 0.1–1.0)
NEUTROS PCT: 75 %
Neutro Abs: 4.4 10*3/uL (ref 1.7–7.7)
Platelets: 137 10*3/uL — ABNORMAL LOW (ref 150–400)
RBC: 3.52 MIL/uL — ABNORMAL LOW (ref 3.87–5.11)
RDW: 15.5 % (ref 11.5–15.5)
WBC: 5.9 10*3/uL (ref 4.0–10.5)

## 2015-05-07 LAB — COMPREHENSIVE METABOLIC PANEL
ALBUMIN: 3.9 g/dL (ref 3.5–5.0)
ALT: 23 U/L (ref 14–54)
ANION GAP: 5 (ref 5–15)
AST: 29 U/L (ref 15–41)
Alkaline Phosphatase: 61 U/L (ref 38–126)
BUN: 15 mg/dL (ref 6–20)
CHLORIDE: 109 mmol/L (ref 101–111)
CO2: 25 mmol/L (ref 22–32)
Calcium: 9.4 mg/dL (ref 8.9–10.3)
Creatinine, Ser: 0.65 mg/dL (ref 0.44–1.00)
GFR calc Af Amer: >60 mL/min (ref >60)
GFR calc non Af Amer: >60 mL/min (ref >60)
GLUCOSE: 113 mg/dL — AB (ref 65–99)
POTASSIUM: 3.7 mmol/L (ref 3.5–5.1)
SODIUM: 139 mmol/L (ref 135–145)
Total Bilirubin: 0.9 mg/dL (ref 0.3–1.2)
Total Protein: 6.4 g/dL — ABNORMAL LOW (ref 6.5–8.1)

## 2015-05-07 MED ORDER — SODIUM CHLORIDE 0.9 % IV SOLN
12.0000 mg | Freq: Once | INTRAVENOUS | Status: AC
  Administered 2015-05-07: 12 mg via INTRAVENOUS
  Filled 2015-05-07: qty 1.2

## 2015-05-07 MED ORDER — SODIUM CHLORIDE 0.9 % IJ SOLN
10.0000 mL | INTRAMUSCULAR | Status: DC | PRN
  Administered 2015-05-07: 10 mL
  Filled 2015-05-07: qty 10

## 2015-05-07 MED ORDER — HEPARIN SOD (PORK) LOCK FLUSH 100 UNIT/ML IV SOLN: 500.0000 [IU] | Freq: Once | INTRAVENOUS | Status: DC | PRN

## 2015-05-07 MED ORDER — PALONOSETRON HCL INJECTION 0.25 MG/5ML
0.2500 mg | Freq: Once | INTRAVENOUS | Status: AC
  Administered 2015-05-07: 0.25 mg via INTRAVENOUS
  Filled 2015-05-07: qty 5

## 2015-05-07 MED ORDER — SODIUM CHLORIDE 0.9 % IV SOLN
Freq: Once | INTRAVENOUS | Status: AC
  Administered 2015-05-07: 13:00:00 via INTRAVENOUS

## 2015-05-07 MED ORDER — SODIUM CHLORIDE 0.9 % IV SOLN
200.0000 mg/m2/d | INTRAVENOUS | Status: DC
  Administered 2015-05-07: 2250 mg via INTRAVENOUS
  Filled 2015-05-07: qty 45

## 2015-05-07 NOTE — Patient Instructions (Signed)
[facility] Discharge Instructions for Patients Receiving Chemotherapy  Today you received the following chemotherapy agents 5FU continuous infusion pump for 192 hours/8days.  To help prevent nausea and vomiting after your treatment, we encourage you to take your nausea medication as instructed. If you develop nausea and vomiting that is not controlled by your nausea medication, call the clinic. If it is after clinic hours your family physician or the after hours number for the clinic or go to the Emergency Department. BELOW ARE SYMPTOMS THAT SHOULD BE REPORTED IMMEDIATELY:  *FEVER GREATER THAN 101.0 F  *CHILLS WITH OR WITHOUT FEVER  NAUSEA AND VOMITING THAT IS NOT CONTROLLED WITH YOUR NAUSEA MEDICATION  *UNUSUAL SHORTNESS OF BREATH  *UNUSUAL BRUISING OR BLEEDING  TENDERNESS IN MOUTH AND THROAT WITH OR WITHOUT PRESENCE OF ULCERS  *URINARY PROBLEMS  *BOWEL PROBLEMS  UNUSUAL RASH Items with * indicate a potential emergency and should be followed up as soon as possible.  Return Tuesday Dec 27th as scheduled.  I have been informed and understand all the instructions given to me. I know to contact the clinic, my physician, or go to the Emergency Department if any problems should occur. I do not have any questions at this time, but understand that I may call the clinic during office hours or the Patient Navigator at [phone] should I have any questions or need assistance in obtaining follow up care.    __________________________________________  _____________  __________ Signature of Patient or Authorized Representative            Date                   Time    __________________________________________ Nurse's Signature

## 2015-05-07 NOTE — Patient Instructions (Signed)
[place] at [facility] Discharge Instructions  RECOMMENDATIONS MADE BY THE CONSULTANT AND ANY TEST RESULTS WILL BE SENT TO YOUR REFERRING PHYSICIAN.  Exam and discussion by Robynn Pane, PA-C If blood counts good we will treat you today and will add another day to your treatment You have an appointment with Dr. Caffie Pinto on January 3 at 12:30 pm.  [address], [place] [phone])  Follow-up as scheduled.  Thank you for choosing [place] at [facility] to provide your oncology and hematology care.  To afford each patient quality time with our provider, please arrive at least 15 minutes before your scheduled appointment time.    You need to re-schedule your appointment should you arrive 10 or more minutes late.  We strive to give you quality time with our providers, and arriving late affects you and other patients whose appointments are after yours.  Also, if you no show three or more times for appointments you may be dismissed from the clinic at the providers discretion.     Again, thank you for choosing [facility].  Our hope is that these requests will decrease the amount of time that you wait before being seen by our physicians.       _____________________________________________________________  Should you have questions after your visit to [facility], please contact our office at [phone] between the hours of 8:30 a.m. and 4:30 p.m.  Voicemails left after 4:30 p.m. will not be returned until the following business day.  For prescription refill requests, have your pharmacy contact our office.

## 2015-05-07 NOTE — Progress Notes (Signed)
Ambulatory on discharge home to self. Continuous infusion pump intact.

## 2015-05-11 ENCOUNTER — Encounter (HOSPITAL_COMMUNITY): Payer: PRIVATE HEALTH INSURANCE

## 2015-05-14 NOTE — Progress Notes (Signed)
[name], MD [address] [place] [address]  Malignant neoplasm of cardia of stomach [facility])  CURRENT THERAPY: 5FU/Leucovorin weekly days 1-7.  INTERVAL HISTORY: Heidi Cooper 59 y.o. 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 Negative margins at time of surgery, and 2/14 positive lymph nodes for metastatic disease.    Gastric cancer ([place])   01/21/2015 Imaging CT abd/pelvis- Gastroptosis with massive enlargement of the stomach, but no acute bowel obstruction. Query severe gastroparesis. See coronal image 33. No other acute or inflammatory process identified in the abdomen or pelvis. Diverticulosis   02/06/2015 - 02/19/2015 Hospital Admission Nausea with vomiting, Gastric outlet obstruction, Malnutrition of moderate degree ([place])   02/06/2015 Procedure EGD by Dr. Oneida Alar   02/06/2015 Pathology Results Stomach, biopsy, pre-pylori - ULCERATED, POORLY DIFFERENTIATED ADENOCARCINOMA   02/09/2015 Definitive Surgery Dr. Arnoldo Morale- partial gastrectomy (distal antrectomy) with Roux-en-Y gastrojejunostomy, right hemicolectomy.   02/09/2015 Pathology Results Stomach, resection for tumor, distal stomach SIGNET RING CELL CARCINOMA (4.5 CM, 1 CM IN DEPTH) TUMOR INVADES VISCERAL PERITONEUM LYMPHOVASCULAR INVASION IDENTIFIED,MARGINS OF RESECTION ARE NEGATIVE FOR TUMOR METASTATIC CARCINOMA IN 2/14   02/09/2015 Pathology Results HER NEGATIVE   02/12/2015 Imaging DG UGI- Patent gastrojejunostomy anastomosis post distal gastrectomy. No evidence of contrast extravasation to suggest anastomotic leak.   03/20/2015 - 04/08/2015 Chemotherapy 5FU/Leucovorin days 1, and 15 x 1 cycle.   04/09/2015 -  Radiation Therapy Dr. Isidore Moos, planned to be completed on 05/18/2015   04/09/2015 -  Chemotherapy 5 FU CI, to be complated on 05/18/2015   04/23/2015 Adverse Reaction Intra-oral ulcers.   Chemotherapy held and dose-reduced.   04/23/2015 Treatment Plan Change Treatment held and dose reduced.    I personally reviewed and went over laboratory results with the patient.  The results are noted within this dictation.  She continues to tolerate treatment well.  She denies any complaints.  She denies any diarrhea, constipation, oral sores, nausea/vomiting, etc.  She notes a nasal congestion that is not productive.  She denies a sore throat.  She denies any coughing.  She does not feel poor or like she has a cold.    Past Medical History  Diagnosis Date  . Migraine   . Gastric cancer ([place]) 02/27/2015    has Abnormal weight loss; Abdominal pain, epigastric; Nausea with vomiting; RLQ abdominal mass; Gastric outlet obstruction; Malnutrition of moderate degree ([place]); GERD (gastroesophageal reflux disease); Hypokalemia; Chronic migraine; and Gastric cancer ([place]) on her problem list.     is allergic to other; sulfa antibiotics; and penicillins.  Current Outpatient Prescriptions on File Prior to Visit  Medication Sig Dispense Refill  . calcium carbonate (TUMS - DOSED IN MG ELEMENTAL CALCIUM) 500 MG chewable tablet Chew 1 tablet by mouth as needed for indigestion or heartburn.    . calcium citrate-vitamin D (CITRACAL+D) 315-200 MG-UNIT tablet Take 1 tablet by mouth daily.    . Cholecalciferol (VITAMIN D3) 2000 UNITS TABS Take 1 tablet by mouth daily.    . cyanocobalamin 500 MCG tablet Take 500 mcg by mouth daily.    Marland Kitchen dextrose 5 % SOLN 1,000 mL with fluorouracil 5 GM/100ML SOLN Inject into the vein. To start around first of November tentatively    . escitalopram (LEXAPRO) 20 MG tablet Take 1/2 tab for 5 days then increase to 1 tab daily. (Patient taking differently: Take 20 mg by mouth  daily. ) 30 tablet 3  . fluticasone (FLONASE) 50 MCG/ACT nasal spray Place 2 sprays into the nose daily as needed for allergies.     Marland Kitchen HYDROcodone-acetaminophen (NORCO) 10-325 MG tablet Take 1 tablet by mouth  every 6 (six) hours as needed. 60 tablet 0  . lidocaine-prilocaine (EMLA) cream Apply a quarter size amount to port site 1 hour prior to chemo. Do not rub in. Cover with plastic wrap. 30 g 2  . Multiple Vitamins-Minerals (MULTIVITAMIN WOMEN 50+ PO) Take 1 tablet by mouth daily.    . ondansetron (ZOFRAN-ODT) 8 MG disintegrating tablet Take 1 tablet (8 mg total) by mouth every 8 (eight) hours as needed for nausea. 30 tablet 2  . polyethylene glycol (MIRALAX / GLYCOLAX) packet Take 17 g by mouth as needed.    . Sennosides (SENOKOT PO) Take 1 tablet by mouth as needed.    . topiramate (TOPAMAX) 25 MG tablet Take 25 mg by mouth at bedtime.     Marland Kitchen loperamide (IMODIUM) 2 MG capsule Take 2 mg by mouth as needed for diarrhea or loose stools. Reported on 05/15/2015    . magic mouthwash SOLN Take 5 mLs by mouth 4 (four) times daily as needed for mouth pain. (Patient not taking: Reported on 05/07/2015) 240 mL 1  . potassium chloride (K-DUR) 10 MEQ tablet Take 1 tablet (10 mEq total) by mouth 2 (two) times daily. (Patient not taking: Reported on 03/20/2015) 28 tablet 0  . promethazine (PHENERGAN) 12.5 MG tablet Take 1 tablet (12.5 mg total) by mouth every 6 (six) hours as needed for nausea or vomiting. (Patient not taking: Reported on 03/20/2015) 30 tablet 1   Current Facility-Administered Medications on File Prior to Visit  Medication Dose Route Frequency Provider Last Rate Last Dose  . fluorouracil (ADRUCIL) 850 mg in sodium chloride 0.9 % 133 mL chemo infusion  200 mg/m2/day (Treatment Plan Actual) Intravenous 3 days Patrici Ranks, MD   850 mg at 05/15/15 1230  . heparin lock flush 100 unit/mL  500 Units Intracatheter Once PRN Patrici Ranks, MD      . sodium chloride 0.9 % injection 10 mL  10 mL Intracatheter PRN Patrici Ranks, MD        Past Surgical History  Procedure Laterality Date  . Tonsillectomy    . Esophagogastroduodenoscopy N/A 02/06/2015    Procedure: ESOPHAGOGASTRODUODENOSCOPY (EGD);   Surgeon: Danie Binder, MD;  Location: [facility];  Service: Endoscopy;  Laterality: N/A;  . Gastrectomy N/A 02/09/2015    Procedure: DISTAL GASTRECTOMY WITH GASTROJEJUNOSTOMY; RIGHT HEMICOLECTOMY;  Surgeon: Aviva Signs, MD;  Location: [facility] ORS;  Service: General;  Laterality: N/A;  . Portacath placement N/A 03/09/2015    Procedure: INSERTION PORT-A-CATH-left subclavian;  Surgeon: Aviva Signs, MD;  Location: [facility] ORS;  Service: General;  Laterality: N/A;    Denies any headaches, dizziness, double vision, fevers, chills, night sweats, nausea, vomiting, diarrhea, constipation, chest pain, heart palpitations, shortness of breath, blood in stool, black tarry stool, urinary pain, urinary burning, urinary frequency, hematuria.   PHYSICAL EXAMINATION  ECOG PERFORMANCE STATUS: 1 - Symptomatic but completely ambulatory  Filed Vitals:   05/15/15 1012  BP: 104/48  Pulse: 65  Temp: 97.7 F (36.5 C)  Resp: 16    GENERAL:alert, no distress, cachectic, comfortable, cooperative, smiling and in an exam room, unaccompanied. SKIN: skin color, texture, turgor are normal, no rashes or significant lesions HEAD: Normocephalic, No masses, lesions, tenderness or abnormalities EYES: normal, PERRLA, EOMI, Conjunctiva are pink  and non-injected EARS: External ears normal OROPHARYNX:lips, buccal mucosa, and tongue normal and mucous membranes are moist  NECK: supple, trachea midline LYMPH:  not examined BREAST:not examined LUNGS: clear to auscultation without wheezes, rales, or rhonchi. HEART: regular rate & rhythm without murmur, rub, or gallop. ABDOMEN:abdomen soft and non-tender BACK: No CVA tenderness EXTREMITIES:less then 2 second capillary refill, no joint deformities, effusion, or inflammation, no skin discoloration, no cyanosis  NEURO: alert & oriented x 3 with fluent speech, no focal motor/sensory deficits   LABORATORY DATA: CBC    Component Value Date/Time   WBC 5.9 05/15/2015 0954   RBC  3.51* 05/15/2015 0954   HGB 11.9* 05/15/2015 0954   HCT 34.5* 05/15/2015 0954   PLT 153 05/15/2015 0954   MCV 98.3 05/15/2015 0954   MCH 33.9 05/15/2015 0954   MCHC 34.5 05/15/2015 0954   RDW 16.5* 05/15/2015 0954   LYMPHSABS 0.5* 05/15/2015 0954   MONOABS 0.4 05/15/2015 0954   EOSABS 0.4 05/15/2015 0954   BASOSABS 0.0 05/15/2015 0954      Chemistry      Component Value Date/Time   NA 138 05/15/2015 0954   K 3.9 05/15/2015 0954   CL 107 05/15/2015 0954   CO2 25 05/15/2015 0954   BUN 14 05/15/2015 0954   CREATININE 0.66 05/15/2015 0954      Component Value Date/Time   CALCIUM 9.3 05/15/2015 0954   ALKPHOS 71 05/15/2015 0954   AST 29 05/15/2015 0954   ALT 29 05/15/2015 0954   BILITOT 0.7 05/15/2015 0954        PENDING LABS:   RADIOGRAPHIC STUDIES:  No results found.   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 Negative margins at time of surgery, and 2/14 positive lymph nodes for metastatic disease.  Oncology history is up-to-date.  Pre-chemo labs today: CBC diff, CMET.    XRT is to continue through 05/18/2015.   The patient has an upcoming apppointment to transfer her oncology care to Medical Oncology in [place], [place] ([place]) due to insurance requirements on 05/22/2015.  She will complete Radiation prior to the New Year.  We have been following treatment according to the NCCN guidelines.  After completing concomitant chemoradiation on 05/18/2015, she will need 2 cycles of: Leucovorin 400 mg/m2 IB days 1 and 15 Fluorouracil 400 mg/m2 IV push on days 1 and 15 Fluorouracil 600 mg/m2 IV continuous infusion over 22 hours daily on days 1, 2, 15, and 16 every 28 days  She did NOT have a D2 dissection at time of surgery.  She has an appointment in [place] on 05/22/2015 for establishment of oncology  care.  Return at the end of this week for pump D/C.  Future follow-up is scheduled for 05/22/2015 in [place], [place].  She knows that she can call us with any issues in the interim.  It has been an absolute pleasure taking care of Ms. Imperato and she is wished the best of luck moving forward    THERAPY PLAN:  Continue treatment as planned.  All questions were answered. The patient knows to call the clinic with any problems, questions or concerns. We can certainly see the patient much sooner if necessary.  Patient and plan discussed with Dr. Ancil Linsey and she is in agreement with the aforementioned.   This note is electronically signed by: Robynn Pane,  PA-C 05/15/2015 12:37 PM

## 2015-05-14 NOTE — Assessment & Plan Note (Addendum)
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 Negative margins at time of surgery, and 2/14 positive lymph nodes for metastatic disease.  Oncology history is up-to-date.  Pre-chemo labs today: CBC diff, CMET.    XRT is to continue through 05/18/2015.   The patient has an upcoming apppointment to transfer her oncology care to Medical Oncology in [place], [place] ([place]) due to insurance requirements on 05/22/2015.  She will complete Radiation prior to the New Year.  We have been following treatment according to the NCCN guidelines.  After completing concomitant chemoradiation on 05/18/2015, she will need 2 cycles of: Leucovorin 400 mg/m2 IB days 1 and 15 Fluorouracil 400 mg/m2 IV push on days 1 and 15 Fluorouracil 600 mg/m2 IV continuous infusion over 22 hours daily on days 1, 2, 15, and 16 every 28 days  She did NOT have a D2 dissection at time of surgery.  She has an appointment in [place] on 05/22/2015 for establishment of oncology care.  Return at the end of this week for pump D/C.  Future follow-up is scheduled for 05/22/2015 in [place], [place].  She knows that she can call us with any issues in the interim.  It has been an absolute pleasure taking care of Ms. Topham and she is wished the best of luck moving forward

## 2015-05-15 ENCOUNTER — Encounter (HOSPITAL_BASED_OUTPATIENT_CLINIC_OR_DEPARTMENT_OTHER): Payer: PRIVATE HEALTH INSURANCE | Admitting: Oncology

## 2015-05-15 ENCOUNTER — Encounter (HOSPITAL_BASED_OUTPATIENT_CLINIC_OR_DEPARTMENT_OTHER): Payer: PRIVATE HEALTH INSURANCE

## 2015-05-15 ENCOUNTER — Encounter (HOSPITAL_COMMUNITY): Payer: Self-pay | Admitting: Oncology

## 2015-05-15 VITALS — BP 104/48 | HR 65 | Temp 97.7°F | Resp 16 | Wt 98.8 lb

## 2015-05-15 DIAGNOSIS — C163 Malignant neoplasm of pyloric antrum: Secondary | ICD-10-CM | POA: Diagnosis not present

## 2015-05-15 DIAGNOSIS — R112 Nausea with vomiting, unspecified: Secondary | ICD-10-CM

## 2015-05-15 DIAGNOSIS — C160 Malignant neoplasm of cardia: Secondary | ICD-10-CM | POA: Diagnosis not present

## 2015-05-15 DIAGNOSIS — Z5111 Encounter for antineoplastic chemotherapy: Secondary | ICD-10-CM | POA: Diagnosis not present

## 2015-05-15 DIAGNOSIS — C169 Malignant neoplasm of stomach, unspecified: Secondary | ICD-10-CM

## 2015-05-15 LAB — COMPREHENSIVE METABOLIC PANEL
ALT: 29 U/L (ref 14–54)
ANION GAP: 6 (ref 5–15)
AST: 29 U/L (ref 15–41)
Albumin: 3.6 g/dL (ref 3.5–5.0)
Alkaline Phosphatase: 71 U/L (ref 38–126)
BUN: 14 mg/dL (ref 6–20)
CHLORIDE: 107 mmol/L (ref 101–111)
CO2: 25 mmol/L (ref 22–32)
CREATININE: 0.66 mg/dL (ref 0.44–1.00)
Calcium: 9.3 mg/dL (ref 8.9–10.3)
Glucose, Bld: 105 mg/dL — ABNORMAL HIGH (ref 65–99)
Potassium: 3.9 mmol/L (ref 3.5–5.1)
Sodium: 138 mmol/L (ref 135–145)
Total Bilirubin: 0.7 mg/dL (ref 0.3–1.2)
Total Protein: 6.2 g/dL — ABNORMAL LOW (ref 6.5–8.1)

## 2015-05-15 LAB — CBC WITH DIFFERENTIAL/PLATELET
Basophils Absolute: 0 10*3/uL (ref 0.0–0.1)
Basophils Relative: 0 %
EOS ABS: 0.4 10*3/uL (ref 0.0–0.7)
EOS PCT: 7 %
HCT: 34.5 % — ABNORMAL LOW (ref 36.0–46.0)
Hemoglobin: 11.9 g/dL — ABNORMAL LOW (ref 12.0–15.0)
LYMPHS ABS: 0.5 10*3/uL — AB (ref 0.7–4.0)
LYMPHS PCT: 9 %
MCH: 33.9 pg (ref 26.0–34.0)
MCHC: 34.5 g/dL (ref 30.0–36.0)
MCV: 98.3 fL (ref 78.0–100.0)
MONO ABS: 0.4 10*3/uL (ref 0.1–1.0)
MONOS PCT: 6 %
Neutro Abs: 4.6 10*3/uL (ref 1.7–7.7)
Neutrophils Relative %: 78 %
PLATELETS: 153 10*3/uL (ref 150–400)
RBC: 3.51 MIL/uL — AB (ref 3.87–5.11)
RDW: 16.5 % — AB (ref 11.5–15.5)
WBC: 5.9 10*3/uL (ref 4.0–10.5)

## 2015-05-15 MED ORDER — DEXAMETHASONE SODIUM PHOSPHATE 100 MG/10ML IJ SOLN
12.0000 mg | Freq: Once | INTRAMUSCULAR | Status: AC
  Administered 2015-05-15: 12 mg via INTRAVENOUS
  Filled 2015-05-15: qty 1.2

## 2015-05-15 MED ORDER — PALONOSETRON HCL INJECTION 0.25 MG/5ML
0.2500 mg | Freq: Once | INTRAVENOUS | Status: AC
  Administered 2015-05-15: 0.25 mg via INTRAVENOUS

## 2015-05-15 MED ORDER — HEPARIN SOD (PORK) LOCK FLUSH 100 UNIT/ML IV SOLN
500.0000 [IU] | Freq: Once | INTRAVENOUS | Status: AC | PRN
  Administered 2015-05-15: 500 [IU]

## 2015-05-15 MED ORDER — HEPARIN SOD (PORK) LOCK FLUSH 100 UNIT/ML IV SOLN
INTRAVENOUS | Status: AC
  Filled 2015-05-15: qty 5

## 2015-05-15 MED ORDER — SODIUM CHLORIDE 0.9 % IV SOLN
Freq: Once | INTRAVENOUS | Status: AC
  Administered 2015-05-15: 12:00:00 via INTRAVENOUS

## 2015-05-15 MED ORDER — FLUOROURACIL CHEMO INJECTION 5 GM/100ML
200.0000 mg/m2/d | INTRAVENOUS | Status: DC
  Administered 2015-05-15: 850 mg via INTRAVENOUS
  Filled 2015-05-15: qty 17

## 2015-05-15 MED ORDER — SODIUM CHLORIDE 0.9 % IJ SOLN
10.0000 mL | INTRAMUSCULAR | Status: DC | PRN
  Administered 2015-05-15: 10 mL
  Filled 2015-05-15: qty 10

## 2015-05-15 NOTE — Patient Instructions (Signed)
[facility] Discharge Instructions for Patients Receiving Chemotherapy  Today you received the following chemotherapy agents:  5FU for home infusion.  If you develop nausea and vomiting, or diarrhea that is not controlled by your medication, call the clinic.  The clinic phone number is [phone]. Office hours are Monday-Friday 8:30am-5:00pm.  BELOW ARE SYMPTOMS THAT SHOULD BE REPORTED IMMEDIATELY:  *FEVER GREATER THAN 101.0 F  *CHILLS WITH OR WITHOUT FEVER  NAUSEA AND VOMITING THAT IS NOT CONTROLLED WITH YOUR NAUSEA MEDICATION  *UNUSUAL SHORTNESS OF BREATH  *UNUSUAL BRUISING OR BLEEDING  TENDERNESS IN MOUTH AND THROAT WITH OR WITHOUT PRESENCE OF ULCERS  *URINARY PROBLEMS  *BOWEL PROBLEMS  UNUSUAL RASH Items with * indicate a potential emergency and should be followed up as soon as possible. If you have an emergency after office hours please contact your primary care physician or go to the nearest emergency department.  Please call the clinic during office hours if you have any questions or concerns.   You may also contact the Patient Navigator at [phone] should you have any questions or need assistance in obtaining follow up care.

## 2015-05-15 NOTE — Patient Instructions (Signed)
[place] at [facility] Discharge Instructions  RECOMMENDATIONS MADE BY THE CONSULTANT AND ANY TEST RESULTS WILL BE SENT TO YOUR REFERRING PHYSICIAN.  Exam and discussion by Robynn Pane, PA-C Plans are to treat today Keep appointment with new MD in January  No follow-up office visit her.  Thank you for choosing [place] at [facility] to provide your oncology and hematology care.  To afford each patient quality time with our provider, please arrive at least 15 minutes before your scheduled appointment time.    You need to re-schedule your appointment should you arrive 10 or more minutes late.  We strive to give you quality time with our providers, and arriving late affects you and other patients whose appointments are after yours.  Also, if you no show three or more times for appointments you may be dismissed from the clinic at the providers discretion.     Again, thank you for choosing [facility].  Our hope is that these requests will decrease the amount of time that you wait before being seen by our physicians.       _____________________________________________________________  Should you have questions after your visit to [facility], please contact our office at [phone] between the hours of 8:30 a.m. and 4:30 p.m.  Voicemails left after 4:30 p.m. will not be returned until the following business day.  For prescription refill requests, have your pharmacy contact our office.

## 2015-05-18 ENCOUNTER — Ambulatory Visit (HOSPITAL_COMMUNITY): Payer: PRIVATE HEALTH INSURANCE | Admitting: Oncology

## 2015-05-18 ENCOUNTER — Encounter (HOSPITAL_BASED_OUTPATIENT_CLINIC_OR_DEPARTMENT_OTHER): Payer: PRIVATE HEALTH INSURANCE

## 2015-05-18 ENCOUNTER — Other Ambulatory Visit (HOSPITAL_COMMUNITY): Payer: Self-pay | Admitting: Oncology

## 2015-05-18 VITALS — BP 121/66 | HR 61 | Temp 97.6°F | Resp 16

## 2015-05-18 DIAGNOSIS — C169 Malignant neoplasm of stomach, unspecified: Secondary | ICD-10-CM | POA: Diagnosis not present

## 2015-05-18 DIAGNOSIS — Z452 Encounter for adjustment and management of vascular access device: Secondary | ICD-10-CM

## 2015-05-18 MED ORDER — HEPARIN SOD (PORK) LOCK FLUSH 100 UNIT/ML IV SOLN
INTRAVENOUS | Status: AC
  Filled 2015-05-18: qty 5

## 2015-05-18 MED ORDER — HEPARIN SOD (PORK) LOCK FLUSH 100 UNIT/ML IV SOLN: 500.0000 [IU] | Freq: Once | INTRAVENOUS | Status: DC | PRN

## 2015-05-18 MED ORDER — SODIUM CHLORIDE 0.9 % IJ SOLN
10.0000 mL | INTRAMUSCULAR | Status: DC | PRN
Start: 2015-05-18 — End: 2015-05-18

## 2015-05-18 NOTE — Progress Notes (Signed)
..  Heidi Cooper returns today for port de access and flush after 72 hr continous infusion of 15fu. Tolerated infusion without problems. Noted 38cc of 88fu remaining. Discussed with T. Kefalas PA-C and pump discontinued per order. Portacath located lt chest wall was  deaccessed and flushed with 74ml NS and 500U/41ml Heparin and needle removed intact.  Procedure without incident. Patient tolerated procedure well.

## 2015-05-19 ENCOUNTER — Encounter (HOSPITAL_COMMUNITY): Payer: PRIVATE HEALTH INSURANCE

## 2015-10-04 IMAGING — RF DG UGI W/ GASTROGRAFIN
17 series · 18 of 18 positions shown · IV contrast (omnipaque)
Comparison: CT abdomen and pelvis 01/21/2015

CLINICAL DATA: Gastric cancer, post distal gastrectomy with
gastrojejunostomy and Roux-en-Y anastomosis

EXAM:
WATER SOLUBLE UPPER GI SERIES
TECHNIQUE: Single-column upper GI series was performed using water soluble
contrast.
CONTRAST:  150mL OMNIPAQUE IOHEXOL 300 MG/ML  SOLN IV

[Series 1: run · 1 of 1 slices shown (1 of 17)]
[im 1/1]
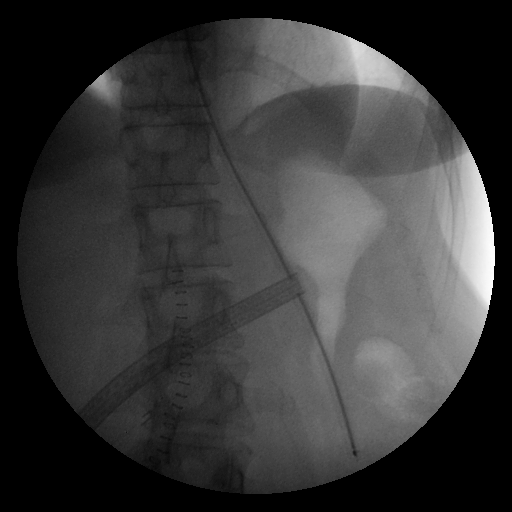

[Series 2: run · 1 of 1 slices shown (2 of 17)]
[im 1/1]
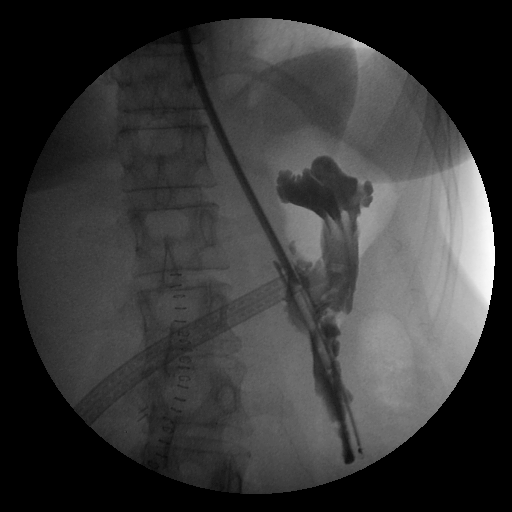

[Series 3: run · 2 of 2 slices shown (3 of 17)]
[im 1/2]
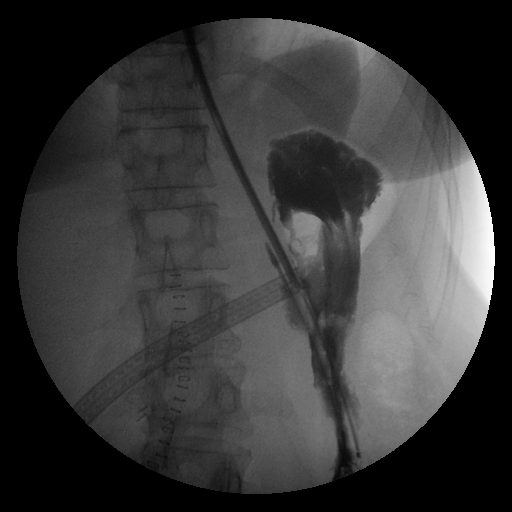
[im 2/2]
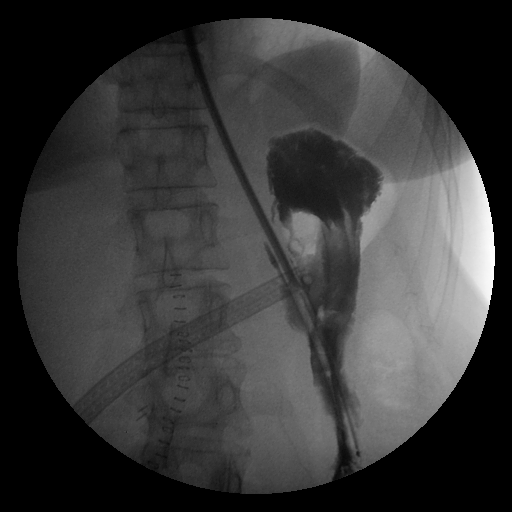

[Series 4: run · 1 of 1 slices shown (4 of 17)]
[im 1/1]
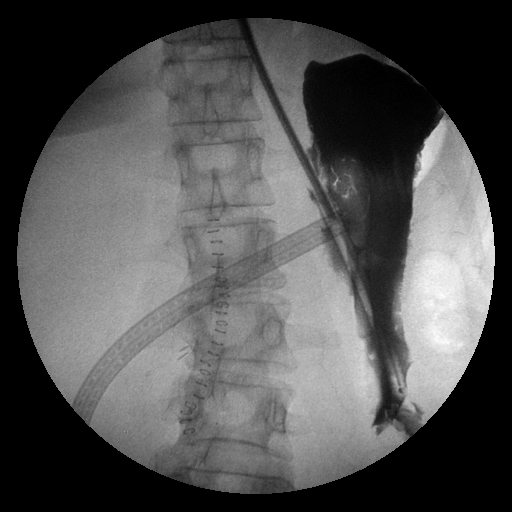

[Series 5: run · 1 of 1 slices shown (5 of 17)]
[im 1/1]
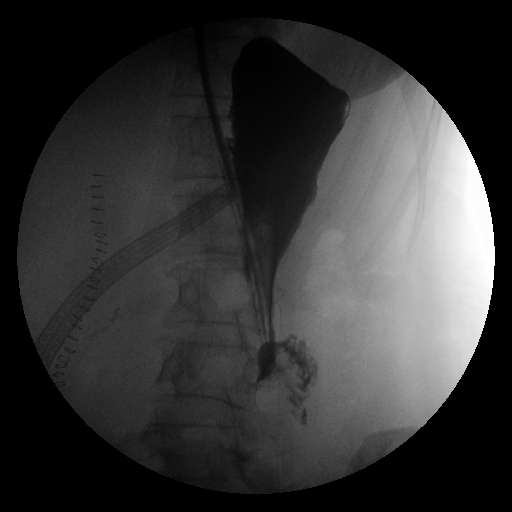

[Series 6: run · 1 of 1 slices shown (6 of 17)]
[im 1/1]
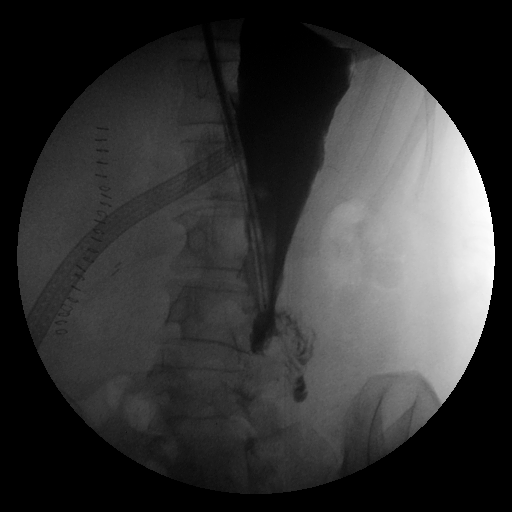

[Series 7: run · 1 of 1 slices shown (7 of 17)]
[im 1/1]
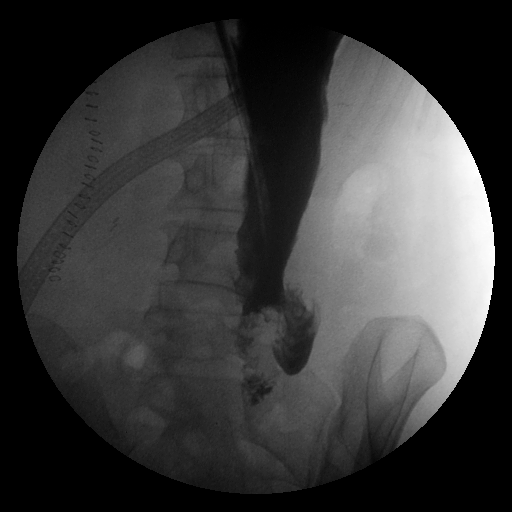

[Series 8: run · 1 of 1 slices shown (8 of 17)]
[im 1/1]
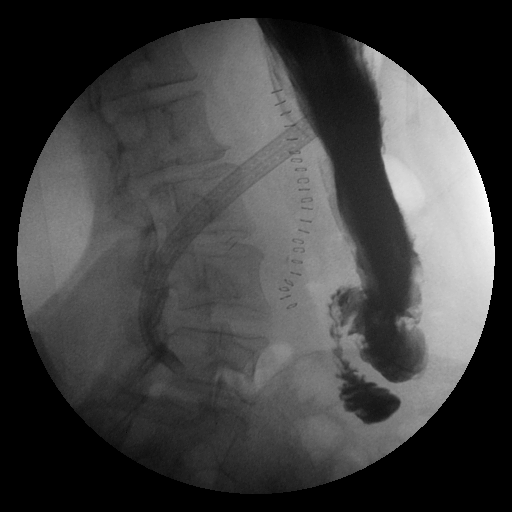

[Series 9: run · 1 of 1 slices shown (9 of 17)]
[im 1/1]
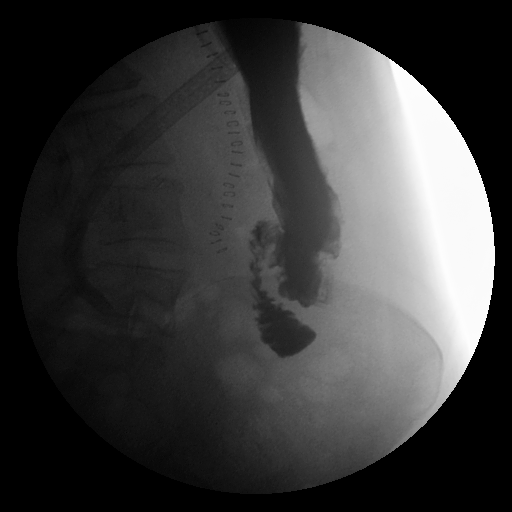

[Series 10: run · 1 of 1 slices shown (10 of 17)]
[im 1/1]
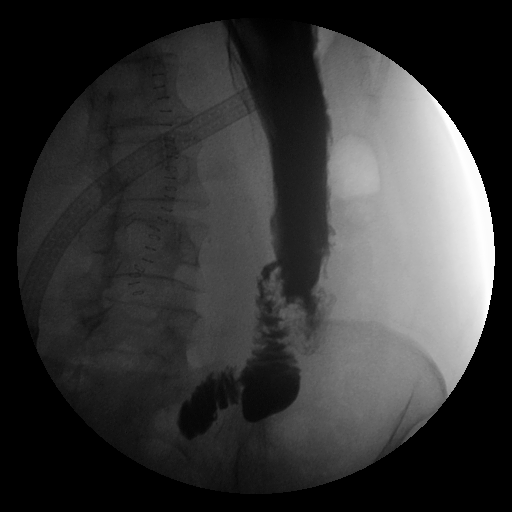

[Series 11: run · 1 of 1 slices shown (11 of 17)]
[im 1/1]
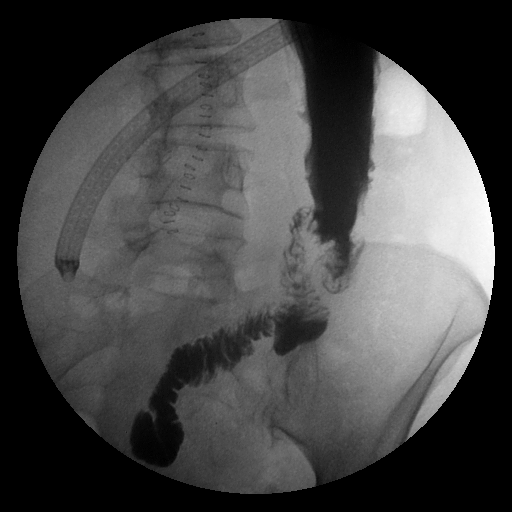

[Series 12: run · 1 of 1 slices shown (12 of 17)]
[im 1/1]
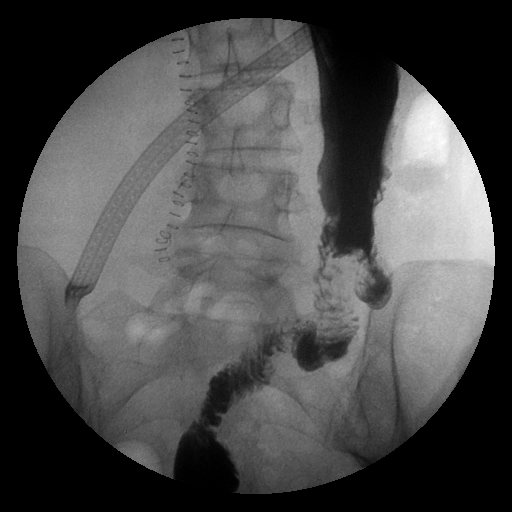

[Series 13: run · 1 of 1 slices shown (13 of 17)]
[im 1/1]
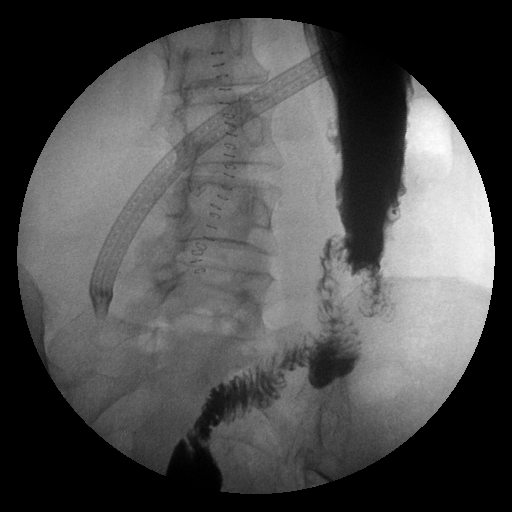

[Series 14: run · 1 of 1 slices shown (14 of 17)]
[im 1/1]
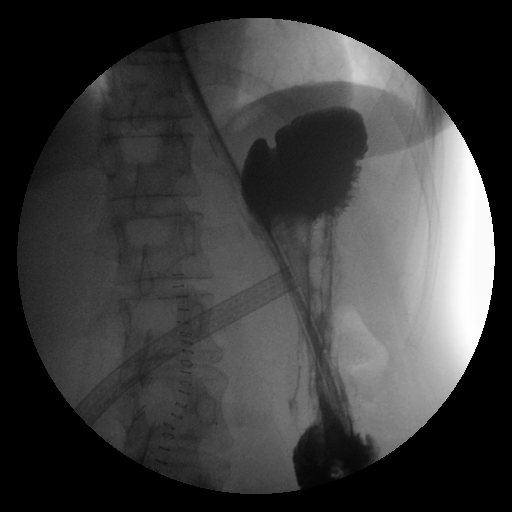

[Series 15: run · 1 of 1 slices shown (15 of 17)]
[im 1/1]
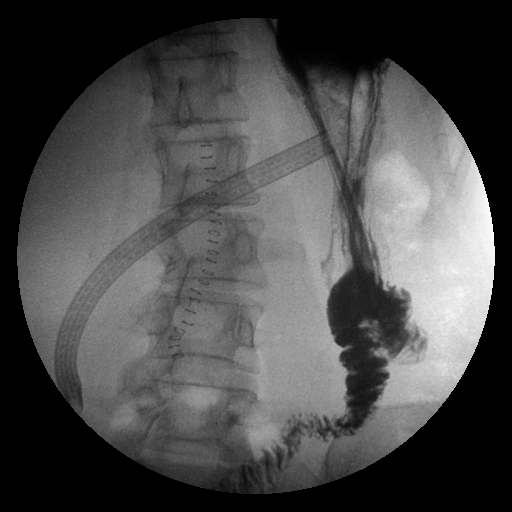

[Series 16: run · 1 of 1 slices shown (16 of 17)]
[im 1/1]
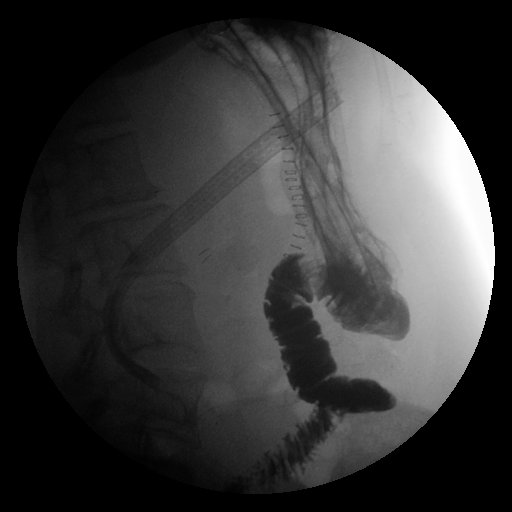

[Series 17: run · 1 of 1 slices shown (17 of 17)]
[im 1/1]
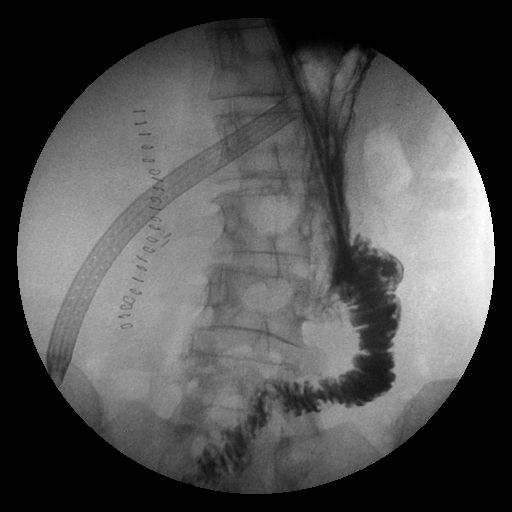

[18 of 18 positions shown; findings below may reference images not displayed]

FLUOROSCOPY TIME:  Radiation Exposure Index (as provided by the
fluoroscopic device): Not provided

If the device does not provide the exposure index:

Fluoroscopy Time (in minutes and seconds):  2 minutes 6 seconds

Number of Acquired Images: 1 plus multiple screen captures during
fluoroscopy
FINDINGS: Nasogastric tube present with tip projecting over distal stomach
near gastrojejunostomy anastomosis.

Laparotomy skin clips.

Surgical drain traverses mid abdomen and LEFT upper quadrant.

Paucity of bowel gas on scout image.

Approximate 100 mL of Omnipaque 300 was instilled via nasogastric
tube into gastric remnant.

Stomach distends normally without irregularity or filling defect.

No gastroesophageal reflux seen during exam.

Post distal gastrectomy with patent gastrojejunostomy anastomosis.

No anastomotic narrowing identified.

Visualized jejunal loops normal appearance.

No contrast extravasation identified during the exam.

No contrast opacification of the surgical drain.
IMPRESSION: Patent gastrojejunostomy anastomosis post distal gastrectomy.

No evidence of contrast extravasation to suggest anastomotic leak.

## 2015-10-29 IMAGING — CR DG CHEST 1V PORT
1 series · 1 of 1 positions shown · non-contrast
Comparison: Chest x-ray 02/08/2015

CLINICAL DATA: Port-A-Cath placement.

EXAM:
DG C-ARM 61-120 MIN; PORTABLE CHEST - 1 VIEW

[ap portable]
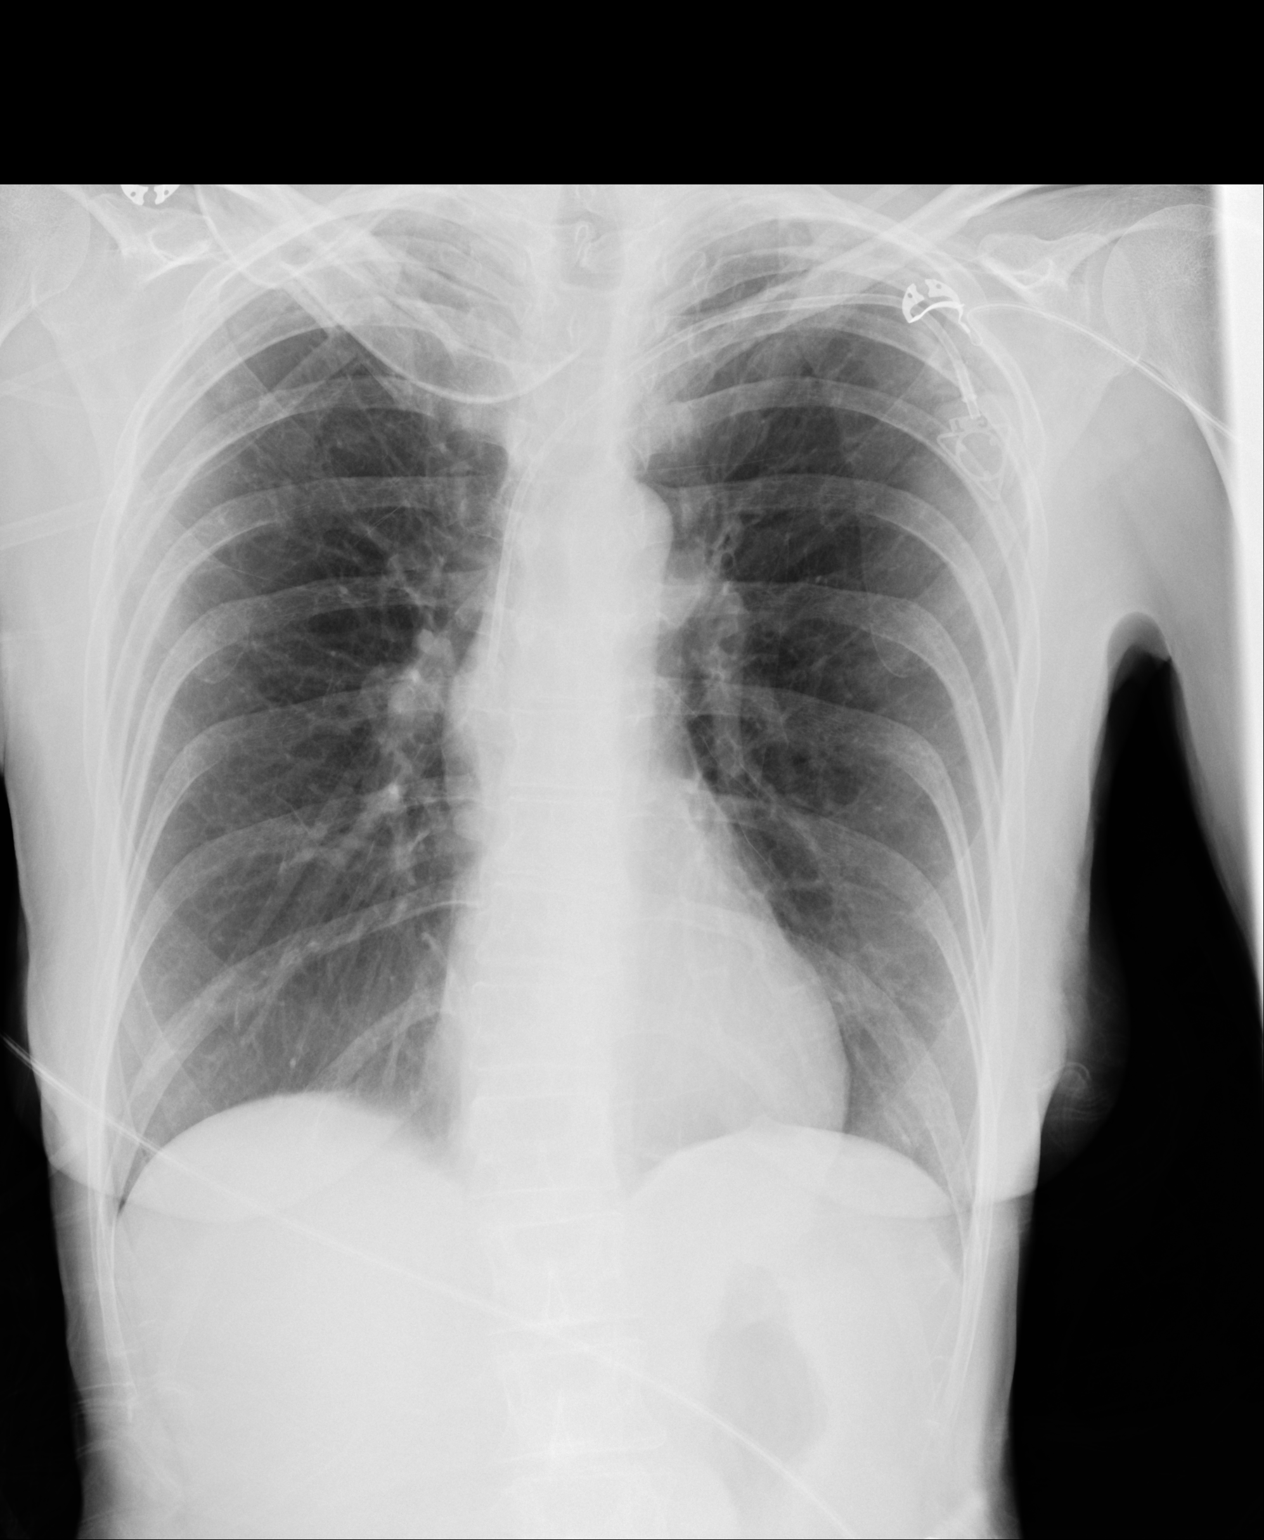

[1 of 1 positions shown; findings below may reference images not displayed]

FINDINGS: The cardiac silhouette, mediastinal and hilar contours are normal.
The lungs are clear. The left subclavian power port tip is in the
mid SVC. No complicating features.
IMPRESSION: Left subclavian power port tip is in the mid SVC. No complicating
features.

## 2015-11-17 DEATH — deceased

## 2018-01-01 ENCOUNTER — Other Ambulatory Visit: Payer: Self-pay | Admitting: Nurse Practitioner

## 2019-04-24 NOTE — Progress Notes (Signed)
REVIEWED-NO ADDITIONAL RECOMMENDATIONS.
